# Patient Record
Sex: Female | Born: 1945 | Race: White | Hispanic: No | Marital: Married | Smoking: Never smoker
Health system: Southern US, Community
[De-identification: ages and names within clinical notes are randomized; demographics above are authoritative.]

## PROBLEM LIST (undated history)

## (undated) DIAGNOSIS — I10 Essential (primary) hypertension: Secondary | ICD-10-CM

## (undated) DIAGNOSIS — I2510 Atherosclerotic heart disease of native coronary artery without angina pectoris: Secondary | ICD-10-CM

## (undated) DIAGNOSIS — C801 Malignant (primary) neoplasm, unspecified: Secondary | ICD-10-CM

## (undated) DIAGNOSIS — E785 Hyperlipidemia, unspecified: Secondary | ICD-10-CM

## (undated) HISTORY — PX: CHOLECYSTECTOMY: SHX55

## (undated) HISTORY — PX: LAMINECTOMY: SHX219

## (undated) HISTORY — PX: ABDOMINAL HYSTERECTOMY: SHX81

## (undated) HISTORY — PX: TOTAL HIP ARTHROPLASTY: SHX124

---

## 2009-10-12 ENCOUNTER — Ambulatory Visit: Payer: Self-pay | Admitting: Unknown Physician Specialty

## 2010-04-04 ENCOUNTER — Ambulatory Visit: Payer: Self-pay | Admitting: Family Medicine

## 2010-04-06 ENCOUNTER — Ambulatory Visit: Payer: Self-pay | Admitting: Internal Medicine

## 2010-04-14 ENCOUNTER — Ambulatory Visit: Payer: Self-pay | Admitting: Internal Medicine

## 2010-09-26 DIAGNOSIS — M1990 Unspecified osteoarthritis, unspecified site: Secondary | ICD-10-CM | POA: Insufficient documentation

## 2010-10-16 ENCOUNTER — Ambulatory Visit: Payer: Self-pay | Admitting: Unknown Physician Specialty

## 2011-07-10 DIAGNOSIS — L739 Follicular disorder, unspecified: Secondary | ICD-10-CM | POA: Insufficient documentation

## 2011-11-06 ENCOUNTER — Ambulatory Visit: Payer: Self-pay

## 2011-12-04 DIAGNOSIS — I2510 Atherosclerotic heart disease of native coronary artery without angina pectoris: Secondary | ICD-10-CM | POA: Insufficient documentation

## 2012-11-11 ENCOUNTER — Ambulatory Visit: Payer: Self-pay | Admitting: Family Medicine

## 2012-11-20 ENCOUNTER — Ambulatory Visit: Payer: Self-pay | Admitting: Family Medicine

## 2013-12-10 ENCOUNTER — Other Ambulatory Visit: Payer: Self-pay | Admitting: Ophthalmology

## 2013-12-10 LAB — CBC WITH DIFFERENTIAL/PLATELET
BASOS ABS: 0 10*3/uL (ref 0.0–0.1)
Basophil %: 0.7 %
EOS ABS: 0.1 10*3/uL (ref 0.0–0.7)
EOS PCT: 1.1 %
HCT: 43 % (ref 35.0–47.0)
HGB: 14.8 g/dL (ref 12.0–16.0)
LYMPHS ABS: 1.5 10*3/uL (ref 1.0–3.6)
LYMPHS PCT: 26.6 %
MCH: 33 pg (ref 26.0–34.0)
MCHC: 34.5 g/dL (ref 32.0–36.0)
MCV: 96 fL (ref 80–100)
MONOS PCT: 8.2 %
Monocyte #: 0.5 x10 3/mm (ref 0.2–0.9)
NEUTROS PCT: 63.4 %
Neutrophil #: 3.5 10*3/uL (ref 1.4–6.5)
Platelet: 196 10*3/uL (ref 150–440)
RBC: 4.48 10*6/uL (ref 3.80–5.20)
RDW: 13.4 % (ref 11.5–14.5)
WBC: 5.6 10*3/uL (ref 3.6–11.0)

## 2013-12-10 LAB — SEDIMENTATION RATE: ERYTHROCYTE SED RATE: 9 mm/h (ref 0–30)

## 2013-12-14 ENCOUNTER — Ambulatory Visit: Payer: Self-pay | Admitting: Family Medicine

## 2013-12-15 DIAGNOSIS — Q245 Malformation of coronary vessels: Secondary | ICD-10-CM | POA: Insufficient documentation

## 2013-12-15 DIAGNOSIS — Z8679 Personal history of other diseases of the circulatory system: Secondary | ICD-10-CM | POA: Insufficient documentation

## 2014-01-13 DIAGNOSIS — M1712 Unilateral primary osteoarthritis, left knee: Secondary | ICD-10-CM | POA: Insufficient documentation

## 2014-03-23 DIAGNOSIS — Z8672 Personal history of thrombophlebitis: Secondary | ICD-10-CM | POA: Insufficient documentation

## 2014-04-11 HISTORY — PX: REPLACEMENT TOTAL KNEE: SUR1224

## 2014-04-29 DIAGNOSIS — Z96652 Presence of left artificial knee joint: Secondary | ICD-10-CM | POA: Insufficient documentation

## 2014-08-06 ENCOUNTER — Ambulatory Visit
Admission: EM | Admit: 2014-08-06 | Discharge: 2014-08-06 | Disposition: A | Discharge status: Home / Self Care | Payer: Medicare Other | Attending: Family Medicine | Admitting: Family Medicine

## 2014-08-06 DIAGNOSIS — B029 Zoster without complications: Secondary | ICD-10-CM | POA: Diagnosis not present

## 2014-08-06 HISTORY — DX: Hyperlipidemia, unspecified: E78.5

## 2014-08-06 MED ORDER — VALACYCLOVIR HCL 1 G PO TABS: 1000.0000 mg | ORAL_TABLET | Freq: Three times a day (TID) | ORAL | Status: AC

## 2014-08-06 MED ORDER — GABAPENTIN 300 MG PO CAPS: 300.0000 mg | ORAL_CAPSULE | Freq: Two times a day (BID) | ORAL | Status: DC

## 2014-08-06 NOTE — ED Provider Notes (Signed)
Patient presents with symptoms of left frontal rash on head. Patient states that she noticed a rash yesterday. She does have tingling and numbness of the left side of her head around that region. She also does have a history of herpes zoster 1 documented. Admits to having had vaccination as well. Denies any fever, chills, URI symptoms, weight loss, generalized fatigue. Patient denies any vision problems or severe eye pain.   ROS: Negative except mentioned above.  Vitals per chart reviewed  GENERAL: NAD HEENT: no pharyngeal erythema, no exudate, no erythema of TMs, no cervical LAD, no lesions around or in the eye noted, PERRL, EOMI RESP: CTA B CARD: RRR NEURO: CN II-XII groslly intact  SKIN: vesicular erythematous rash on left frontal aspect of head, no lesions in scalp or in/on ear    A/P:Symptoms and exam suggestive of herpes zoster. Will treat patient with Valtrex, Neurontin. I encourage patient to follow up with the eye doctor if she experiences any eye pain or other eye symptoms. She is to seek medical attention if there are any worsening symptoms. Also encourage patient to avoid large groups, pregnant women, young children, those that may be immunocompromised until lesions have crusted over.   Paulina Fusi, MD 08/06/14 1230

## 2014-08-06 NOTE — Discharge Instructions (Signed)

## 2014-08-06 NOTE — ED Notes (Signed)
Painful, red rash on Left side of face starting Wednesday. States the pain is burning. Located on hairline. Pt reports that she feels the pain radiating to  Orbital area, as well as inside left ear. Pt believes this to be shingles. Pt has a h/o shingles on right leg several years ago. There was a reoccurrence on her facial area some years later. Pt has since had what was believed to shingles after that incident. Pt states that she had the Zostavax about 1 year ago.

## 2014-09-10 ENCOUNTER — Encounter: Payer: Self-pay | Admitting: Gynecology

## 2014-09-10 ENCOUNTER — Ambulatory Visit
Admission: EM | Admit: 2014-09-10 | Discharge: 2014-09-10 | Disposition: A | Discharge status: Home / Self Care | Payer: Medicare Other | Attending: Family Medicine | Admitting: Family Medicine

## 2014-09-10 DIAGNOSIS — L02425 Furuncle of right lower limb: Secondary | ICD-10-CM | POA: Diagnosis not present

## 2014-09-10 HISTORY — DX: Atherosclerotic heart disease of native coronary artery without angina pectoris: I25.10

## 2014-09-10 MED ORDER — DOXYCYCLINE HYCLATE 100 MG PO TABS: 100.0000 mg | ORAL_TABLET | Freq: Two times a day (BID) | ORAL | Status: DC

## 2014-09-10 NOTE — ED Notes (Signed)
Patient c/o blisters on right pinky toe/ right hip x 3 days ago.

## 2014-09-10 NOTE — ED Provider Notes (Signed)
CSN: [id number]     Arrival date & time 09/10/14  1031 History   First MD Initiated Contact with Patient 09/10/14 1114     Chief Complaint  Patient presents with  . Blister   (Consider location/radiation/quality/duration/timing/severity/associated sxs/prior Treatment) HPI Comments: 69 yo female with a 3 days h/o "blisters" to right leg and foot. States has one on top of foot that is draining a little. Denies any fevers or chills.   The history is provided by the patient.    Past Medical History  Diagnosis Date  . Hyperlipidemia   . Cardiovascular disease    Past Surgical History  Procedure Laterality Date  . Replacement total knee Left 04/11/2014  . Total hip arthroplasty    . Cholecystectomy    . Laminectomy    . Abdominal hysterectomy     No family history on file. History  Substance Use Topics  . Smoking status: Never Smoker   . Smokeless tobacco: Never Used  . Alcohol Use: Yes     Comment: OCCASIONAL   OB History    No data available     Review of Systems  Allergies  Augmentin; Darvon; and Keflex  Home Medications   Prior to Admission medications   Medication Sig Start Date End Date Taking? Authorizing Provider  aspirin 81 MG tablet Take 81 mg by mouth daily.   Yes Historical Provider, MD  Calcium-Vitamin D 600-200 MG-UNIT per tablet Take 1 tablet by mouth daily.   Yes Historical Provider, MD  Flaxseed, Linseed, (FLAX SEED OIL PO) Take by mouth.   Yes Historical Provider, MD  Multiple Vitamin (MULTIVITAMIN) tablet Take 1 tablet by mouth daily.   Yes Historical Provider, MD  rosuvastatin (CRESTOR) 10 MG tablet Take 10 mg by mouth daily.   Yes Historical Provider, MD  doxycycline (VIBRA-TABS) 100 MG tablet Take 1 tablet (100 mg total) by mouth 2 (two) times daily. 09/10/14   Norval Gable, MD  gabapentin (NEURONTIN) 300 MG capsule Take 1 capsule (300 mg total) by mouth 2 (two) times daily. 08/06/14 08/13/14  Paulina Fusi, MD   BP 153/69 mmHg  Pulse 83   Temp(Src) 97.7 F (36.5 C) (Tympanic)  Ht 5\' 6"  (1.676 m)  Wt 172 lb (78.019 kg)  BMI 27.77 kg/m2  SpO2 99% Physical Exam  Constitutional: She appears well-developed and well-nourished. No distress.  Skin: She is not diaphoretic.  Five x 1cm erythematous, papulopustular slightly raised and slightly tender skin lesions on right lower extremity (one on dorsum of foot with mild drainage)   Nursing note and vitals reviewed.   ED Course  Procedures (including critical care time) Labs Review Labs Reviewed - No data to display  Imaging Review No results found.   MDM   1. Furuncle of right lower extremity    Discharge Medication List as of 09/10/2014 11:39 AM    START taking these medications   Details  doxycycline (VIBRA-TABS) 100 MG tablet Take 1 tablet (100 mg total) by mouth 2 (two) times daily., Starting 09/10/2014, Until Discontinued, Normal      Plan: 1. diagnosis reviewed with patient 2. rx as per orders; risks, benefits, potential side effects reviewed with patient 3. Recommend supportive treatment with warm compresses to area 4. F/u prn if symptoms worsen or don't improve    Norval Gable, MD 09/10/14 1140

## 2014-11-16 ENCOUNTER — Other Ambulatory Visit: Payer: Self-pay | Admitting: Family Medicine

## 2014-12-14 ENCOUNTER — Other Ambulatory Visit: Payer: Self-pay | Admitting: Family Medicine

## 2014-12-14 DIAGNOSIS — Z1231 Encounter for screening mammogram for malignant neoplasm of breast: Secondary | ICD-10-CM

## 2014-12-20 ENCOUNTER — Ambulatory Visit
Admission: RE | Admit: 2014-12-20 | Discharge: 2014-12-20 | Disposition: A | Discharge status: Home / Self Care | Payer: Medicare Other | Source: Ambulatory Visit | Attending: Family Medicine | Admitting: Family Medicine

## 2014-12-20 DIAGNOSIS — Z1231 Encounter for screening mammogram for malignant neoplasm of breast: Secondary | ICD-10-CM | POA: Diagnosis present

## 2014-12-20 HISTORY — DX: Malignant (primary) neoplasm, unspecified: C80.1

## 2015-01-24 DIAGNOSIS — H1131 Conjunctival hemorrhage, right eye: Secondary | ICD-10-CM | POA: Insufficient documentation

## 2015-01-24 DIAGNOSIS — H532 Diplopia: Secondary | ICD-10-CM | POA: Insufficient documentation

## 2015-01-24 DIAGNOSIS — I6381 Other cerebral infarction due to occlusion or stenosis of small artery: Secondary | ICD-10-CM | POA: Insufficient documentation

## 2015-11-16 ENCOUNTER — Other Ambulatory Visit: Payer: Self-pay | Admitting: Family Medicine

## 2015-11-16 DIAGNOSIS — Z1231 Encounter for screening mammogram for malignant neoplasm of breast: Secondary | ICD-10-CM

## 2015-12-21 ENCOUNTER — Ambulatory Visit
Admission: RE | Admit: 2015-12-21 | Discharge: 2015-12-21 | Disposition: A | Discharge status: Home / Self Care | Payer: Medicare Other | Source: Ambulatory Visit | Attending: Family Medicine | Admitting: Family Medicine

## 2015-12-21 DIAGNOSIS — Z1231 Encounter for screening mammogram for malignant neoplasm of breast: Secondary | ICD-10-CM | POA: Insufficient documentation

## 2016-03-09 ENCOUNTER — Encounter: Payer: Self-pay | Admitting: Gynecology

## 2016-03-09 ENCOUNTER — Ambulatory Visit
Admission: EM | Admit: 2016-03-09 | Discharge: 2016-03-09 | Disposition: A | Discharge status: Home / Self Care | Payer: Medicare Other | Attending: Family Medicine | Admitting: Family Medicine

## 2016-03-09 ENCOUNTER — Ambulatory Visit: Payer: Medicare Other

## 2016-03-09 DIAGNOSIS — M25532 Pain in left wrist: Secondary | ICD-10-CM | POA: Insufficient documentation

## 2016-03-09 DIAGNOSIS — W010XXA Fall on same level from slipping, tripping and stumbling without subsequent striking against object, initial encounter: Secondary | ICD-10-CM | POA: Diagnosis not present

## 2016-03-09 DIAGNOSIS — S62115A Nondisplaced fracture of triquetrum [cuneiform] bone, left wrist, initial encounter for closed fracture: Secondary | ICD-10-CM | POA: Diagnosis not present

## 2016-03-09 NOTE — ED Provider Notes (Addendum)
[facility] ____________________________________________  Time seen: Approximately 6:19 PM  I have reviewed the triage vital signs and the nursing notes.   HISTORY  Chief Complaint Wrist Injury   HPI Brenda English is a 70 y.o. female presents with a complaint of left wrist pain post fall today. Patient reports occurred approximately 1 hour prior to arrival. Patient reports that she was sidestepping between a chair and stockings to put candy in stockings, reports her right foot slid on the carpet, causing her to lose her balance and fall. Patient reports that when she fell she tried to catch herself on outstretched left hand. Patient reports immediate onset of left wrist pain after the fall. Patient reports during the fall she did bump her head on a plastic keyboard, denies loss of consciousness or pain to head. Denies headache, vision changes, nausea, vomiting. Denies any bleeding.  Patient reports continued left wrist pain since injury. Denies any other pain or injury. Reports right hand dominant. Denies any paresthesias, decreased range of motion, pain radiation. Patient reports feels well otherwise. States she did apply ice and take over-the-counter ibuprofen prior to arrival which helped with her pain. States pain is mild at this time. Patient reports she does follow with an orthopedist at [facility] for arthritis and previous replacements. Denies use of anticoagulants.  Denies chest pain, shortness of breath, abdominal pain, neck or back pain, or other complaints.   Valera Castle, MD: PCP    Past Medical History:  Diagnosis Date  . Cancer ([place])    skin ca  . Cardiovascular disease   . Hyperlipidemia     There are no active problems to display for this patient.   Past Surgical History:  Procedure Laterality Date  . ABDOMINAL HYSTERECTOMY    . CHOLECYSTECTOMY    . LAMINECTOMY    . 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 TOTAL HIP ARTHROPLASTY     No  current facility-administered medications for this encounter.   Current Outpatient Prescriptions:  .  aspirin 81 MG tablet, Take 81 mg by mouth daily., Disp: , Rfl:  .  Calcium-Vitamin D 600-200 MG-UNIT per tablet, Take 1 tablet by mouth daily., Disp: , Rfl:  .  Flaxseed, Linseed, (FLAX SEED OIL PO), Take by mouth., Disp: , Rfl:  .  Multiple Vitamin (MULTIVITAMIN) tablet, Take 1 tablet by mouth daily., Disp: , Rfl:  .  rosuvastatin (CRESTOR) 10 MG tablet, Take 10 mg by mouth daily., Disp: , Rfl:  .  doxycycline (VIBRA-TABS) 100 MG tablet, Take 1 tablet (100 mg total) by mouth 2 (two) times daily., Disp: 20 tablet, Rfl: 0 .  gabapentin (NEURONTIN) 300 MG capsule, Take 1 capsule (300 mg total) by mouth 2 (two) times daily., Disp: 14 capsule, Rfl: 0  Allergies Augmentin [amoxicillin-pot clavulanate]; Darvon [propoxyphene]; and Keflex [cephalexin]  Family History  Problem Relation Age of Onset  . Breast cancer Sister 58    Social History Social History  Substance Use Topics  . Smoking status: Never Smoker  . Smokeless tobacco: Never Used  . Alcohol use Yes     Comment: OCCASIONAL    Review of Systems Constitutional: No fever/chills Eyes: No visual changes. ENT: No sore throat. Cardiovascular: Denies chest pain. Respiratory: Denies shortness of breath. Gastrointestinal: No abdominal pain.  No nausea, no vomiting.  No diarrhea.  No constipation. Genitourinary: Negative for dysuria. Musculoskeletal: Negative for back pain. As above.  Skin: Negative for rash. Neurological: Negative for headaches, focal weakness or numbness.  10-point  ROS otherwise negative.  ____________________________________________   PHYSICAL EXAM:  VITAL SIGNS: ED Triage Vitals  Enc Vitals Group     BP 03/09/16 1602 (!) 143/68     Pulse Rate 03/09/16 1602 86     Resp 03/09/16 1602 16     Temp 03/09/16 1602 98.5 F (36.9 C)     Temp Source 03/09/16 1602 Oral     SpO2 03/09/16 1602 96 %     Weight  03/09/16 1604 175 lb (79.4 kg)     Height 03/09/16 1604 5\' 6"  (1.676 m)     Head Circumference --      Peak Flow --      Pain Score 03/09/16 1605 4     Pain Loc --      Pain Edu? --      Excl. in [place]? --     Constitutional: Alert and oriented. Well appearing and in no acute distress. Eyes: Conjunctivae are normal. PERRL. EOMI. ENT      Head: Normocephalic and atraumatic.Nontender.       Mouth/Throat: Mucous membranes are moist. Cardiovascular: Normal rate, regular rhythm. Grossly normal heart sounds.  Good peripheral circulation. Respiratory: Normal respiratory effort without tachypnea nor retractions. Breath sounds are clear and equal bilaterally. No wheezes/rales/rhonchi. Musculoskeletal: Ambulatory with steady gait. No midline cervical, thoracic or lumbar tenderness to palpation. Except : Left mid to lateral wrist mild tenderness to palpation, mild swelling, no ecchymosis, no snuff box tenderness, mild pain with wrist rotation but full range of motion present, able to make full left fist, no left hand motor or tendon deficits noted, bilateral hand grips strong and equal, bilateral distal radial pulses strong and equal, normal distal left hand sensation and capillary refill, left upper extremity otherwise nontender.  Neurologic:  Normal speech and language.  Speech is normal. No gait instability.  Skin:  Skin is warm, dry and intact. No rash noted. Psychiatric: Mood and affect are normal. Speech and behavior are normal. Patient exhibits appropriate insight and judgment   ___________________________________________   LABS (all labs ordered are listed, but only abnormal results are displayed)  Labs Reviewed - No data to display  RADIOLOGY  Dg Wrist Complete Left  Result Date: 03/09/2016 CLINICAL DATA:  [place] this afternoon onto medial LEFT wrist, now with wrist pain and mid LEFT hand pain with grass thinning of objects, history of arthritis, initial encounter EXAM: LEFT WRIST -  COMPLETE 3+ VIEW COMPARISON:  None FINDINGS: Osseous demineralization. Degenerative changes at first [facility] joint. Remain joint spaces preserved. No acute fracture, dislocation, or bone destruction. IMPRESSION: Osseous demineralization with degenerative changes at first [facility] joint. No acute bony abnormalities. Electronically Signed   By: Lavonia Dana M.D.   On: 03/09/2016 16:56   Dg Hand Complete Left  Result Date: 03/09/2016 CLINICAL DATA:  [place] this afternoon onto medial LEFT wrist, now with wrist pain and mid LEFT hand pain with grass thinning of objects, history of arthritis, initial encounter EXAM: LEFT HAND - COMPLETE 3+ VIEW COMPARISON:  None. FINDINGS: Osseous demineralization. Scattered degenerative changes of interphalangeal joints LEFT hand and at first [facility] joint, less at STT joint. Nondisplaced fracture at base of triquetrum. No additional fracture, dislocation or bone destruction. IMPRESSION: Osseous demineralization with scattered degenerative changes LEFT hand. Nondisplaced fracture at base of triquetrum. Electronically Signed   By: Lavonia Dana M.D.   On: 03/09/2016 16:59   ____________________________________________   PROCEDURES Procedures    INITIAL IMPRESSION / ASSESSMENT AND PLAN / ED COURSE  Pertinent labs & imaging results that were available during my care of the patient were reviewed by me and considered in my medical decision making (see chart for details).  Well-appearing patient. No acute distress. Presents with a complaint of left wrist pain post mechanical fall. Reports fall occurred just prior to arrival. Patient denies any other pain or injury. No focal neurological deficits. Mild mid wrist and lateral wrist pain, full range of motion, no motor or tendon deficits. X-rays reviewed. Per radiologist, nondisplaced fracture at the base of the triquetrum. Discussed in detail with patient. Patient applied in left volar OCL splint. Encouraged rest, ice, elevation and orthopedic  follow-up this week. X-ray CD given. Patient denies need for prescription medication.   Discussed follow up with Primary care physician this week. Discussed follow up and return parameters including no resolution or any worsening concerns. Patient verbalized understanding and agreed to plan.   ____________________________________________   FINAL CLINICAL IMPRESSION(S) / ED DIAGNOSES  Final diagnoses:  Nondisplaced fracture of triquetrum (cuneiform) bone, left wrist, initial encounter for closed fracture     Discharge Medication List as of 03/09/2016  5:17 PM      Note: This dictation was prepared with Dragon dictation along with smaller phrase technology. Any transcriptional errors that result from this process are unintentional.    Clinical Course       Marylene Land, NP 03/09/16 1828    Marylene Land, NP 03/09/16 [phone]

## 2016-03-09 NOTE — ED Triage Notes (Signed)
Per patient loss balance at home today and fell to the left and now with left wrist/hand pain.

## 2016-03-09 NOTE — Discharge Instructions (Signed)
Keep in splint. Ice and elevate as discussed.   Follow up with your orthopedist at [facility] this coming week as discussed.   Follow up with your primary care physician this week as needed. Return to Urgent care for new or worsening concerns.

## 2016-03-29 DIAGNOSIS — S62115D Nondisplaced fracture of triquetrum [cuneiform] bone, left wrist, subsequent encounter for fracture with routine healing: Secondary | ICD-10-CM | POA: Insufficient documentation

## 2016-04-05 DIAGNOSIS — J4520 Mild intermittent asthma, uncomplicated: Secondary | ICD-10-CM | POA: Insufficient documentation

## 2016-04-24 DIAGNOSIS — I10 Essential (primary) hypertension: Secondary | ICD-10-CM | POA: Insufficient documentation

## 2016-05-02 DIAGNOSIS — Z96641 Presence of right artificial hip joint: Secondary | ICD-10-CM | POA: Insufficient documentation

## 2016-06-20 DIAGNOSIS — T84050A Periprosthetic osteolysis of internal prosthetic right hip joint, initial encounter: Secondary | ICD-10-CM | POA: Insufficient documentation

## 2016-06-27 DIAGNOSIS — Z96641 Presence of right artificial hip joint: Secondary | ICD-10-CM | POA: Insufficient documentation

## 2016-12-21 ENCOUNTER — Encounter: Payer: Self-pay | Admitting: *Deleted

## 2016-12-21 ENCOUNTER — Ambulatory Visit
Admission: EM | Admit: 2016-12-21 | Discharge: 2016-12-21 | Disposition: A | Discharge status: Home / Self Care | Payer: Medicare Other | Attending: Family Medicine | Admitting: Family Medicine

## 2016-12-21 DIAGNOSIS — Z880 Allergy status to penicillin: Secondary | ICD-10-CM | POA: Insufficient documentation

## 2016-12-21 DIAGNOSIS — N390 Urinary tract infection, site not specified: Secondary | ICD-10-CM

## 2016-12-21 DIAGNOSIS — Z87440 Personal history of urinary (tract) infections: Secondary | ICD-10-CM | POA: Diagnosis present

## 2016-12-21 DIAGNOSIS — I2510 Atherosclerotic heart disease of native coronary artery without angina pectoris: Secondary | ICD-10-CM | POA: Diagnosis not present

## 2016-12-21 DIAGNOSIS — E785 Hyperlipidemia, unspecified: Secondary | ICD-10-CM | POA: Insufficient documentation

## 2016-12-21 DIAGNOSIS — R3915 Urgency of urination: Secondary | ICD-10-CM | POA: Diagnosis present

## 2016-12-21 DIAGNOSIS — R350 Frequency of micturition: Secondary | ICD-10-CM | POA: Diagnosis present

## 2016-12-21 LAB — URINALYSIS, COMPLETE (UACMP) WITH MICROSCOPIC

## 2016-12-21 MED ORDER — NITROFURANTOIN MONOHYD MACRO 100 MG PO CAPS: 100.0000 mg | ORAL_CAPSULE | Freq: Two times a day (BID) | ORAL | 0 refills | Status: DC

## 2016-12-21 NOTE — Discharge Instructions (Signed)
Take medication as prescribed. Rest. Drink plenty of fluids.  ° °Follow up with your primary care physician this week as needed. Return to Urgent care for new or worsening concerns.  ° °

## 2016-12-21 NOTE — ED Triage Notes (Signed)
Patient started having symptoms urinary frequency and retention this AM.

## 2016-12-21 NOTE — ED Provider Notes (Signed)
[facility] ____________________________________________  Time seen: Approximately 12:50 PM  I have reviewed the triage vital signs and the nursing notes.   HISTORY  Chief Complaint Urinary Frequency  HPI Brenda English is a 71 y.o. female presenting for evaluation of urinary frequency, urinary urgency and feeling of incomplete emptying of bladder since this morning. Denies burning with urination. Reports some suprapubic discomfort. Denies fevers, atypical back pain, vomiting, other abdominal discomfort. Reports continues to eat and drink well. States did take over-the-counter Azo prior to arrival which helped with discomfort. Denies known trigger. Reports feels consistent with previous UTIs. Denies recent UTI. States no recent antibiotic use. Denies other aggravating or alleviating factors. Denies chest pain, shortness of breath, abdominal pain. Denies recent sickness. Denies recent antibiotic use.   Valera Castle, MD: PCP   Past Medical History:  Diagnosis Date  . Cancer ([place])    skin ca  . Cardiovascular disease   . Hyperlipidemia     There are no active problems to display for this patient.   Past Surgical History:  Procedure Laterality Date  . ABDOMINAL HYSTERECTOMY    . CHOLECYSTECTOMY    . LAMINECTOMY    . 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 TOTAL HIP ARTHROPLASTY       No current facility-administered medications for this encounter.   Current Outpatient Prescriptions:  .  amLODipine (NORVASC) 2.5 MG tablet, Take 2.5 mg by mouth daily., Disp: , Rfl:  .  aspirin 81 MG tablet, Take 81 mg by mouth daily., Disp: , Rfl:  .  Calcium-Vitamin D 600-200 MG-UNIT per tablet, Take 1 tablet by mouth daily., Disp: , Rfl:  .  Flaxseed, Linseed, (FLAX SEED OIL PO), Take by mouth., Disp: , Rfl:  .  rosuvastatin (CRESTOR) 10 MG tablet, Take 10 mg by mouth daily., Disp: , Rfl:  .  doxycycline (VIBRA-TABS) 100 MG tablet, Take 1 tablet (100 mg total) by  mouth 2 (two) times daily., Disp: 20 tablet, Rfl: 0 .  gabapentin (NEURONTIN) 300 MG capsule, Take 1 capsule (300 mg total) by mouth 2 (two) times daily., Disp: 14 capsule, Rfl: 0 .  Multiple Vitamin (MULTIVITAMIN) tablet, Take 1 tablet by mouth daily., Disp: , Rfl:  .  nitrofurantoin, macrocrystal-monohydrate, (MACROBID) 100 MG capsule, Take 1 capsule (100 mg total) by mouth 2 (two) times daily., Disp: 10 capsule, Rfl: 0  Allergies Augmentin [amoxicillin-pot clavulanate]; Darvon [propoxyphene]; and Keflex [cephalexin]  Family History  Problem Relation Age of Onset  . Breast cancer Sister 47    Social History Social History  Substance Use Topics  . Smoking status: Never Smoker  . Smokeless tobacco: Never Used  . Alcohol use Yes     Comment: OCCASIONAL    Review of Systems Constitutional: No fever/chills Cardiovascular: Denies chest pain. Respiratory: Denies shortness of breath. Gastrointestinal: No abdominal pain.  No nausea, no vomiting.  No diarrhea.  Genitourinary: Positive for dysuria. Musculoskeletal: Negative for back pain. Skin: Negative for rash.   ____________________________________________   PHYSICAL EXAM:  VITAL SIGNS: ED Triage Vitals  Enc Vitals Group     BP 12/21/16 1155 127/69     Pulse Rate 12/21/16 1155 83     Resp 12/21/16 1155 16     Temp 12/21/16 1155 98.1 F (36.7 C)     Temp Source 12/21/16 1155 Oral     SpO2 12/21/16 1155 97 %     Weight 12/21/16 1157 183 lb (83 kg)     Height 12/21/16 1157 5'  6" (1.676 m)     Head Circumference --      Peak Flow --      Pain Score 12/21/16 1157 0     Pain Loc --      Pain Edu? --      Excl. in [place]? --     Constitutional: Alert and oriented. Well appearing and in no acute distress. Cardiovascular: Normal rate, regular rhythm. Grossly normal heart sounds.  Good peripheral circulation. Respiratory: Normal respiratory effort without tachypnea nor retractions. Breath sounds are clear and equal bilaterally.  No wheezes, rales, rhonchi. Gastrointestinal: Soft and nontender. No CVA tenderness. Musculoskeletal: No midline cervical, thoracic or lumbar tenderness to palpation.  Neurologic:  Normal speech and language. Speech is normal. No gait instability.  Skin:  Skin is warm, dry. Psychiatric: Mood and affect are normal. Speech and behavior are normal. Patient exhibits appropriate insight and judgment   ___________________________________________   LABS (all labs ordered are listed, but only abnormal results are displayed)  Labs Reviewed  URINALYSIS, COMPLETE (UACMP) WITH MICROSCOPIC - Abnormal; Notable for the following:       Result Value   Color, Urine ORANGE (*)    APPearance CLOUDY (*)    Glucose, UA   (*)    Value: TEST NOT REPORTED DUE TO COLOR INTERFERENCE OF URINE PIGMENT   Hgb urine dipstick   (*)    Value: TEST NOT REPORTED DUE TO COLOR INTERFERENCE OF URINE PIGMENT   Bilirubin Urine   (*)    Value: TEST NOT REPORTED DUE TO COLOR INTERFERENCE OF URINE PIGMENT   Ketones, ur   (*)    Value: TEST NOT REPORTED DUE TO COLOR INTERFERENCE OF URINE PIGMENT   Protein, ur   (*)    Value: TEST NOT REPORTED DUE TO COLOR INTERFERENCE OF URINE PIGMENT   Nitrite   (*)    Value: TEST NOT REPORTED DUE TO COLOR INTERFERENCE OF URINE PIGMENT   Leukocytes, UA   (*)    Value: TEST NOT REPORTED DUE TO COLOR INTERFERENCE OF URINE PIGMENT   Squamous Epithelial / LPF 6-30 (*)    Bacteria, UA FEW (*)    All other components within normal limits  URINE CULTURE    RADIOLOGY  No results found. ____________________________________________   PROCEDURES Procedures   INITIAL IMPRESSION / ASSESSMENT AND PLAN / ED COURSE  Pertinent labs & imaging results that were available during my care of the patient were reviewed by me and considered in my medical decision making (see chart for details).  Well-appearing patient. No acute distress. Dysuria symptoms since this morning. Urinalysis reviewed, will  culture. Will treat patient with oral Macrobid. Encourage rest, fluids and supportive care.Discussed indication, risks and benefits of medications with patient.  Discussed follow up with Primary care physician this week. Discussed follow up and return parameters including no resolution or any worsening concerns. Patient verbalized understanding and agreed to plan.   ____________________________________________   FINAL CLINICAL IMPRESSION(S) / ED DIAGNOSES  Final diagnoses:  Urinary tract infection without hematuria, site unspecified     Discharge Medication List as of 12/21/2016 12:59 PM    START taking these medications   Details  nitrofurantoin, macrocrystal-monohydrate, (MACROBID) 100 MG capsule Take 1 capsule (100 mg total) by mouth 2 (two) times daily., Starting Sat 12/21/2016, Normal        Note: This dictation was prepared with Dragon dictation along with smaller phrase technology. Any transcriptional errors that result from this process are unintentional.  Marylene Land, NP 12/21/16 1426

## 2016-12-23 LAB — URINE CULTURE

## 2016-12-30 ENCOUNTER — Other Ambulatory Visit: Payer: Self-pay | Admitting: Family Medicine

## 2016-12-30 DIAGNOSIS — Z1231 Encounter for screening mammogram for malignant neoplasm of breast: Secondary | ICD-10-CM

## 2017-01-09 ENCOUNTER — Ambulatory Visit
Admission: RE | Admit: 2017-01-09 | Discharge: 2017-01-09 | Disposition: A | Discharge status: Home / Self Care | Payer: Medicare Other | Source: Ambulatory Visit | Attending: Family Medicine | Admitting: Family Medicine

## 2017-01-09 DIAGNOSIS — Z1231 Encounter for screening mammogram for malignant neoplasm of breast: Secondary | ICD-10-CM | POA: Insufficient documentation

## 2017-02-07 ENCOUNTER — Ambulatory Visit: Payer: Medicare Other

## 2017-02-07 ENCOUNTER — Ambulatory Visit
Admission: EM | Admit: 2017-02-07 | Discharge: 2017-02-07 | Disposition: A | Discharge status: Home / Self Care | Payer: Medicare Other | Attending: Family Medicine | Admitting: Family Medicine

## 2017-02-07 ENCOUNTER — Encounter: Payer: Self-pay | Admitting: *Deleted

## 2017-02-07 DIAGNOSIS — M778 Other enthesopathies, not elsewhere classified: Secondary | ICD-10-CM

## 2017-02-07 DIAGNOSIS — M25531 Pain in right wrist: Secondary | ICD-10-CM | POA: Insufficient documentation

## 2017-02-07 DIAGNOSIS — M779 Enthesopathy, unspecified: Secondary | ICD-10-CM | POA: Diagnosis not present

## 2017-02-07 MED ORDER — MELOXICAM 15 MG PO TABS: 15.0000 mg | ORAL_TABLET | Freq: Every day | ORAL | 0 refills | Status: DC | PRN

## 2017-02-07 NOTE — ED Provider Notes (Signed)
[facility] ____________________________________________  Time seen: Approximately 3:40 PM  I have reviewed the triage vital signs and the nursing notes.   HISTORY  Chief Complaint Wrist Pain   HPI Brenda English is a 71 y.o. female presenting for evaluation of right medial wrist pain present for the last 4-5 days.  Patient does report she may have injured it when bumping into the wall while carrying a laundry basket, but does not recall any pain from that injury.  States that she is right-handed and has been doing a lot of repetitive movements with her right hand.  Reports that she knows she has arthritis in her hand and similar area.  Denies fall or other injury.  Denies any redness, swelling, skin changes.  States pain hurts mostly with thumb movement.  Reports that she did have a previous thumb tendon injury years ago.  Patient reports continues to eat and drink well.  States has taken occasional ibuprofen over the counter, noncontinuous.  Reports otherwise feels well.  Denies renal insufficiency.Denies chest pain, shortness of breath, abdominal pain, or rash. Denies recent sickness. Denies recent antibiotic use.   Valera Castle, MD: PCP   Past Medical History:  Diagnosis Date  . Cancer ([place])    skin ca  . Cardiovascular disease   . Hyperlipidemia     There are no active problems to display for this patient.   Past Surgical History:  Procedure Laterality Date  . ABDOMINAL HYSTERECTOMY    . CHOLECYSTECTOMY    . LAMINECTOMY    . 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 TOTAL HIP ARTHROPLASTY       No current facility-administered medications for this encounter.   Current Outpatient Medications:  .  amLODipine (NORVASC) 2.5 MG tablet, Take 2.5 mg by mouth daily., Disp: , Rfl:  .  aspirin 81 MG tablet, Take 81 mg by mouth daily., Disp: , Rfl:  .  Calcium-Vitamin D 600-200 MG-UNIT per tablet, Take 1 tablet by mouth daily., Disp: , Rfl:  .  Flaxseed,  Linseed, (FLAX SEED OIL PO), Take by mouth., Disp: , Rfl:  .  Multiple Vitamin (MULTIVITAMIN) tablet, Take 1 tablet by mouth daily., Disp: , Rfl:  .  rosuvastatin (CRESTOR) 10 MG tablet, Take 10 mg by mouth daily., Disp: , Rfl:  .  gabapentin (NEURONTIN) 300 MG capsule, Take 1 capsule (300 mg total) by mouth 2 (two) times daily., Disp: 14 capsule, Rfl: 0 .  meloxicam (MOBIC) 15 MG tablet, Take 1 tablet (15 mg total) by mouth daily as needed., Disp: 10 tablet, Rfl: 0  Allergies Augmentin [amoxicillin-pot clavulanate]; Darvon [propoxyphene]; and Keflex [cephalexin]  Family History  Problem Relation Age of Onset  . Breast cancer Sister 75    Social History Social History   Tobacco Use  . Smoking status: Never Smoker  . Smokeless tobacco: Never Used  Substance Use Topics  . Alcohol use: Yes    Comment: OCCASIONAL  . Drug use: No    Review of Systems Constitutional: No fever/chills Cardiovascular: Denies chest pain. Respiratory: Denies shortness of breath. Gastrointestinal: No abdominal pain.   Musculoskeletal: Negative for back pain.  As above. Skin: Negative for rash.   ____________________________________________   PHYSICAL EXAM:  VITAL SIGNS: ED Triage Vitals  Enc Vitals Group     BP 02/07/17 1518 (!) 154/68     Pulse Rate 02/07/17 1518 82     Resp 02/07/17 1518 16     Temp 02/07/17 1518 97.9 F (36.6 C)  Temp Source 02/07/17 1518 Oral     SpO2 02/07/17 1518 100 %     Weight 02/07/17 1521 182 lb (82.6 kg)     Height 02/07/17 1521 5\' 6"  (1.676 m)     Head Circumference --      Peak Flow --      Pain Score 02/07/17 1523 5     Pain Loc --      Pain Edu? --      Excl. in [place]? --     Constitutional: Alert and oriented. Well appearing and in no acute distress. Cardiovascular: Normal rate, regular rhythm. Grossly normal heart sounds.  Good peripheral circulation. Respiratory: Normal respiratory effort without tachypnea nor retractions. Breath sounds are clear  and equal bilaterally. No wheezes, rales, rhonchi. Musculoskeletal: Steady gait.  Bilateral distal radial pulses equal and easily palpated.  Right hand grips left greater than right.  Right hand no motor or tendon deficits noted.  Pain along the radial as of the medial wrist up to first metacarpal, no swelling, no ecchymosis, arthritic appearance present to first MCP, pain increases with resisted thumb flexion and extension, no true snuffbox tenderness, full range of motion present, but pain with rotation. Neurologic:  Normal speech and language. No gross focal neurologic deficits are appreciated. Speech is normal. No gait instability.  Skin:  Skin is warm, dry and intact. No rash noted. Psychiatric: Mood and affect are normal. Speech and behavior are normal. Patient exhibits appropriate insight and judgment   ___________________________________________   LABS (all labs ordered are listed, but only abnormal results are displayed)  Labs Reviewed - No data to display ____________________________________________  RADIOLOGY  Dg Wrist Complete Right  Result Date: 02/07/2017 CLINICAL DATA:  Right wrist pain after injury several days ago EXAM: RIGHT WRIST - COMPLETE 3+ VIEW COMPARISON:  None. FINDINGS: There is no evidence of fracture or dislocation. Narrowing and sclerosis of the first carpometacarpal joint is noted. Soft tissues are unremarkable. IMPRESSION: Osteoarthritis of the first carpometacarpal joint. No acute abnormality seen in the right wrist. Electronically Signed   By: Marijo Conception, M.D.   On: 02/07/2017 16:07   ____________________________________________   PROCEDURES Procedures    INITIAL IMPRESSION / ASSESSMENT AND PLAN / ED COURSE  Pertinent labs & imaging results that were available during my care of the patient were reviewed by me and considered in my medical decision making (see chart for details).  Well-appearing patient.  No acute distress.  Presenting for  evaluation of right medial wrist pain.  X-ray reviewed, osteoarthritis, no acute abnormality noted.  Suspect tendinitis.  Thumb spica splint applied, encourage rest,rest, ice, immobilization but also stretching.  Will start on daily Mobic.  Encourage PCP or orthopedic follow-up for continued pain. Discussed indication, risks and benefits of medications with patient.  Discussed follow up with Primary care physician this week. Discussed follow up and return parameters including no resolution or any worsening concerns. Patient verbalized understanding and agreed to plan.   ____________________________________________   FINAL CLINICAL IMPRESSION(S) / ED DIAGNOSES  Final diagnoses:  Right wrist pain  Right wrist tendinitis     ED Discharge Orders        Ordered    meloxicam (MOBIC) 15 MG tablet  Daily PRN     02/07/17 1618       Note: This dictation was prepared with Dragon dictation along with smaller phrase technology. Any transcriptional errors that result from this process are unintentional.  Marylene Land, NP 02/07/17 1743

## 2017-02-07 NOTE — Discharge Instructions (Signed)
Take medication as prescribed. Rest. Drink plenty of fluids.   Follow up with orthopedic as needed for continued pain.   Follow up with your primary care physician this week as needed. Return to Urgent care for new or worsening concerns.

## 2017-02-07 NOTE — ED Triage Notes (Signed)
Gradual onset right wrist pain past 4-5 days. Denies injury.

## 2017-04-28 DIAGNOSIS — R42 Dizziness and giddiness: Secondary | ICD-10-CM | POA: Insufficient documentation

## 2017-05-10 ENCOUNTER — Encounter: Payer: Self-pay | Admitting: Emergency Medicine

## 2017-05-10 ENCOUNTER — Emergency Department: Payer: Medicare Other

## 2017-05-10 ENCOUNTER — Emergency Department
Admission: EM | Admit: 2017-05-10 | Discharge: 2017-05-10 | Disposition: A | Discharge status: Home / Self Care | Payer: Medicare Other | Attending: Emergency Medicine | Admitting: Emergency Medicine

## 2017-05-10 ENCOUNTER — Ambulatory Visit (INDEPENDENT_AMBULATORY_CARE_PROVIDER_SITE_OTHER)
Admission: EM | Admit: 2017-05-10 | Discharge: 2017-05-10 | Disposition: A | Discharge status: Other Acute Inpatient Hospital | Payer: Medicare Other | Source: Home / Self Care

## 2017-05-10 ENCOUNTER — Other Ambulatory Visit: Payer: Self-pay

## 2017-05-10 DIAGNOSIS — Z9049 Acquired absence of other specified parts of digestive tract: Secondary | ICD-10-CM

## 2017-05-10 DIAGNOSIS — Z96652 Presence of left artificial knee joint: Secondary | ICD-10-CM | POA: Insufficient documentation

## 2017-05-10 DIAGNOSIS — Z79899 Other long term (current) drug therapy: Secondary | ICD-10-CM | POA: Insufficient documentation

## 2017-05-10 DIAGNOSIS — R0609 Other forms of dyspnea: Secondary | ICD-10-CM

## 2017-05-10 DIAGNOSIS — Z7982 Long term (current) use of aspirin: Secondary | ICD-10-CM | POA: Insufficient documentation

## 2017-05-10 DIAGNOSIS — Z880 Allergy status to penicillin: Secondary | ICD-10-CM

## 2017-05-10 DIAGNOSIS — Z791 Long term (current) use of non-steroidal anti-inflammatories (NSAID): Secondary | ICD-10-CM

## 2017-05-10 DIAGNOSIS — Z96649 Presence of unspecified artificial hip joint: Secondary | ICD-10-CM

## 2017-05-10 DIAGNOSIS — R079 Chest pain, unspecified: Secondary | ICD-10-CM

## 2017-05-10 DIAGNOSIS — E785 Hyperlipidemia, unspecified: Secondary | ICD-10-CM | POA: Insufficient documentation

## 2017-05-10 DIAGNOSIS — I10 Essential (primary) hypertension: Secondary | ICD-10-CM | POA: Insufficient documentation

## 2017-05-10 DIAGNOSIS — Z881 Allergy status to other antibiotic agents status: Secondary | ICD-10-CM

## 2017-05-10 DIAGNOSIS — I2510 Atherosclerotic heart disease of native coronary artery without angina pectoris: Secondary | ICD-10-CM

## 2017-05-10 HISTORY — DX: Essential (primary) hypertension: I10

## 2017-05-10 LAB — COMPREHENSIVE METABOLIC PANEL
ALBUMIN: 4.4 g/dL (ref 3.5–5.0)
ALT: 20 U/L (ref 14–54)
ANION GAP: 8 (ref 5–15)
AST: 26 U/L (ref 15–41)
Alkaline Phosphatase: 83 U/L (ref 38–126)
BILIRUBIN TOTAL: 0.9 mg/dL (ref 0.3–1.2)
BUN: 15 mg/dL (ref 6–20)
CHLORIDE: 109 mmol/L (ref 101–111)
CO2: 23 mmol/L (ref 22–32)
Calcium: 9.8 mg/dL (ref 8.9–10.3)
Creatinine, Ser: 0.9 mg/dL (ref 0.44–1.00)
GFR calc Af Amer: >60 mL/min (ref >60)
GLUCOSE: 103 mg/dL — AB (ref 65–99)
POTASSIUM: 4 mmol/L (ref 3.5–5.1)
Sodium: 140 mmol/L (ref 135–145)
TOTAL PROTEIN: 7.6 g/dL (ref 6.5–8.1)

## 2017-05-10 LAB — CBC
HEMATOCRIT: 42.6 % (ref 35.0–47.0)
HEMOGLOBIN: 14.7 g/dL (ref 12.0–16.0)
MCH: 31.6 pg (ref 26.0–34.0)
MCHC: 34.4 g/dL (ref 32.0–36.0)
MCV: 91.9 fL (ref 80.0–100.0)
Platelets: 221 10*3/uL (ref 150–440)
RBC: 4.64 MIL/uL (ref 3.80–5.20)
RDW: 13.7 % (ref 11.5–14.5)
WBC: 6.4 10*3/uL (ref 3.6–11.0)

## 2017-05-10 LAB — TROPONIN I: Troponin I: <0.03 ng/mL (ref <0.03)

## 2017-05-10 MED ORDER — NITROGLYCERIN 0.4 MG SL SUBL: 0.4000 mg | SUBLINGUAL_TABLET | SUBLINGUAL | 3 refills | Status: AC | PRN

## 2017-05-10 MED ORDER — ASPIRIN 81 MG PO CHEW
162.0000 mg | CHEWABLE_TABLET | Freq: Once | ORAL | Status: AC
  Administered 2017-05-10: 162 mg via ORAL

## 2017-05-10 NOTE — ED Triage Notes (Signed)
Mid chest pain since 3am. [facility] this am and had EKG there. Given 162mg  ASA at urgent care also.

## 2017-05-10 NOTE — Discharge Instructions (Signed)
-  Given year chest pain, recent shortness of breath on exertion, and mild swelling in your with your history of heart disease and diabetes, go to recommend that you go to an ER for further evaluation. -Not going to the ER could result in serious complications as well all heart problems presents with severe symptoms. -Follow-up with your primary care provider and your cardiologist.  Keep your cardiology appointment.

## 2017-05-10 NOTE — ED Triage Notes (Signed)
Patient c/o chest pain and SOB that started off and on last night.

## 2017-05-10 NOTE — ED Provider Notes (Signed)
[facility]    CSN: [id number] Arrival date & time: 05/10/17  1036     History   Chief Complaint Chief Complaint  Patient presents with  . Chest Pain    HPI Brenda English is a 72 y.o. female.   Patient is a 72 year old female who presents with complaint of chest pain that started overnight.  Patient states pain is midsternal and describes the pain as similar to a few overworked her chest muscles that resulted in count of pain in the next day.  Patient does report taking her blood pressure overnight was 140 over the 80s and her pulse was 92, which she states is high for her.  Patient reports worsening dyspnea on exertion over the last 3-4 weeks.  She states she went to a concert last night and had to hold onto her husband while walking up the hill coming out of the venue.  Additionally she states she has not been able to walk as far around her property at home as she had in the past.  Patient does have a history of coronary artery disease and is on a statin as well as blood pressure medications.  She is followed by [facility] cardiology.  Patient's chart does show a cardiac cath from back in 2006 which showed a 50% lesion of the LAD.  She did have a stress test back in 2016 which was negative.  She states she is actually due for a follow-up with her cardiologist this Wednesday.  Patient states she does have some chronic mild right lower extremity edema from her hip surgeries and that is why she wears support stockings.  Patient states she did take an aspirin 81 mg this morning as well as her blood pressure medicine.  Patient denies any nausea.  Shortness of breath as above.      Past Medical History:  Diagnosis Date  . Cancer ([place])    skin ca  . Cardiovascular disease   . Hyperlipidemia     There are no active problems to display for this patient.   Past Surgical History:  Procedure Laterality Date  . ABDOMINAL HYSTERECTOMY    . CHOLECYSTECTOMY    . LAMINECTOMY    .  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 TOTAL HIP ARTHROPLASTY      OB History    No data available       Home Medications    Prior to Admission medications   Medication Sig Start Date End Date Taking? Authorizing Provider  amLODipine (NORVASC) 2.5 MG tablet Take 2.5 mg by mouth daily.   Yes [provider]  aspirin 81 MG tablet Take 81 mg by mouth daily.   Yes [provider]  Calcium-Vitamin D 600-200 MG-UNIT per tablet Take 1 tablet by mouth daily.   Yes [provider]  Flaxseed, Linseed, (FLAX SEED OIL PO) Take by mouth.   Yes [provider]  Multiple Vitamin (MULTIVITAMIN) tablet Take 1 tablet by mouth daily.   Yes [provider]  rosuvastatin (CRESTOR) 10 MG tablet Take 10 mg by mouth daily.   Yes [provider]  gabapentin (NEURONTIN) 300 MG capsule Take 1 capsule (300 mg total) by mouth 2 (two) times daily. 08/06/14 08/13/14  Paulina Fusi, MD  meloxicam (MOBIC) 15 MG tablet Take 1 tablet (15 mg total) by mouth daily as needed. 02/07/17   Marylene Land, NP    Family History Family History  Problem Relation Age of Onset  . Breast cancer  Sister 37    Social History Social History   Tobacco Use  . Smoking status: Never Smoker  . Smokeless tobacco: Never Used  Substance Use Topics  . Alcohol use: Yes    Comment: OCCASIONAL  . Drug use: No     Allergies   Augmentin [amoxicillin-pot clavulanate]; Darvon [propoxyphene]; and Keflex [cephalexin]   Review of Systems Review of Systems As noted above in HPI.  Other systems reviewed and found to be negative  Physical Exam Triage Vital Signs ED Triage Vitals  Enc Vitals Group     BP 05/10/17 1048 (!) 146/67     Pulse Rate 05/10/17 1048 99     Resp 05/10/17 1048 16     Temp 05/10/17 1048 98 F (36.7 C)     Temp Source 05/10/17 1048 Oral     SpO2 05/10/17 1048 98 %     Weight 05/10/17 1045 180 lb (81.6 kg)     Height 05/10/17 1045 5\' 6"  (1.676 m)      Head Circumference --      Peak Flow --      Pain Score 05/10/17 1044 3     Pain Loc --      Pain Edu? --      Excl. in [place]? --    No data found.  Updated Vital Signs BP (!) 146/67 (BP Location: Left Arm)   Pulse 99   Temp 98 F (36.7 C) (Oral)   Resp 16   Ht 5\' 6"  (1.676 m)   Wt 180 lb (81.6 kg)   SpO2 98%   BMI 29.05 kg/m    Physical Exam  Constitutional: She is oriented to person, place, and time. She appears well-developed and well-nourished. She does not appear ill.  HENT:  Head: Normocephalic and atraumatic.  Eyes: Pupils are equal, round, and reactive to light.  Neck: Normal range of motion.  Cardiovascular: Normal rate, regular rhythm and normal pulses. Exam reveals no distant heart sounds.    No systolic murmur is present.  No diastolic murmur is present. Pulmonary/Chest: Effort normal and breath sounds normal. No respiratory distress.  Abdominal: Soft. Bowel sounds are normal. There is tenderness.  Musculoskeletal: Normal range of motion.       Right lower leg: She exhibits edema (minimal).       Left lower leg: She exhibits edema (minimal).  Neurological: She is alert and oriented to person, place, and time.  Skin: Skin is warm and dry.  Psychiatric: She has a normal mood and affect.     UC Treatments / Results  Labs (all labs ordered are listed, but only abnormal results are displayed) Labs Reviewed - No data to display  EKG EKG done in the clinic: Sinus rhythm with rate of 99.  No acute ST changes or abnormalities.  No inverted T waves.  Radiology No results found.  Procedures Procedures (including critical care time)  Medications Ordered in UC Medications  aspirin chewable tablet 162 mg (162 mg Oral Given 05/10/17 1130)     Initial Impression / Assessment and Plan / UC Course  I have reviewed the triage vital signs and the nursing notes.  Pertinent labs & imaging results that were available during my care of the patient were reviewed by me  and considered in my medical decision making (see chart for details).     Patient with midsternal chest pain that began overnight.  Patient with known coronary artery disease with a 50% lesion back in 2006.  She does have a recent normal stress test 2016 but she also reports increasing dyspnea on exertion over the last 3-4 weeks and now has some minimal-mild lower extremity edema.  Patient took a 81 mg aspirin this morning, we will go ahead and give her 2 more to bring her up to the recommended 320 mg of aspirin.  I recommended patient proceed to the ER for further evaluation as we do not have the ability to obtain cardiac enzymes here today.  Patient verbalized understanding is in agreement.  She states that she was going to go to her house which is around the corner and have her husband take her to the ER.  Given patient's seemingly stable at this point will agree with that plan.  Make sure she understands possible silent presentations of heart issues and that there could be some serious worsening complications should she not go to be evaluated.  Patient again verbalized understanding is agreement with going to the ER.  Final Clinical Impressions(s) / UC Diagnoses   Final diagnoses:  Chest pain, unspecified type  CAD in native artery  Essential hypertension  DOE (dyspnea on exertion)    ED Discharge Orders    None      Controlled Substance Prescriptions [place] Controlled Substance Registry consulted? Not Applicable   Luvenia Redden, PA-C 05/10/17 1138

## 2017-05-10 NOTE — ED Provider Notes (Signed)
[facility] Emergency Department Provider Note       Time seen: ----------------------------------------- 3:26 PM on 05/10/2017 -----------------------------------------   I have reviewed the triage vital signs and the nursing notes.  HISTORY   Chief Complaint Chest Pain    HPI Brenda English is a 72 y.o. female with a history of cancer, cardiovascular disease, hyperlipidemia and hypertension who presents to the ED for mid chest pain since 3 AM.  Patient states she had chest pain in the midsternum that seemed to be more on the right side.  She saw them in urgent care this morning and had an EKG done there.  She was given 2 baby aspirin at the urgent care also.  She complains of 4 out of 10 chest pain, presents for further evaluation.  Past Medical History:  Diagnosis Date  . Cancer ([place])    skin ca  . Cardiovascular disease   . Hyperlipidemia   . Hypertension     There are no active problems to display for this patient.   Past Surgical History:  Procedure Laterality Date  . ABDOMINAL HYSTERECTOMY    . CHOLECYSTECTOMY    . LAMINECTOMY    . 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 TOTAL HIP ARTHROPLASTY      Allergies Augmentin [amoxicillin-pot clavulanate]; Darvon [propoxyphene]; and Keflex [cephalexin]  Social History Social History   Tobacco Use  . Smoking status: Never Smoker  . Smokeless tobacco: Never Used  Substance Use Topics  . Alcohol use: Yes    Comment: OCCASIONAL  . Drug use: No    Review of Systems Constitutional: Negative for fever. Cardiovascular: Positive for chest pain Respiratory: Negative for shortness of breath. Gastrointestinal: Negative for abdominal pain, vomiting and diarrhea. Genitourinary: Negative for dysuria. Musculoskeletal: Negative for back pain. Skin: Negative for rash. Neurological: Negative for headaches, focal weakness or numbness.  All systems negative/normal/unremarkable except as stated in  the HPI  ____________________________________________   PHYSICAL EXAM:  VITAL SIGNS: ED Triage Vitals  Enc Vitals Group     BP 05/10/17 1254 (!) 168/69     Pulse Rate 05/10/17 1254 88     Resp 05/10/17 1254 18     Temp 05/10/17 1254 (!) 97.5 F (36.4 C)     Temp Source 05/10/17 1254 Oral     SpO2 05/10/17 1254 98 %     Weight 05/10/17 1254 180 lb (81.6 kg)     Height 05/10/17 1254 5\' 6"  (1.676 m)     Head Circumference --      Peak Flow --      Pain Score 05/10/17 1258 4     Pain Loc --      Pain Edu? --      Excl. in [place]? --     Constitutional: Alert and oriented. Well appearing and in no distress. Eyes: Conjunctivae are normal. Normal extraocular movements. ENT   Head: Normocephalic and atraumatic.   Nose: No congestion/rhinnorhea.   Mouth/Throat: Mucous membranes are moist.   Neck: No stridor. Cardiovascular: Normal rate, regular rhythm. No murmurs, rubs, or gallops. Respiratory: Normal respiratory effort without tachypnea nor retractions. Breath sounds are clear and equal bilaterally. No wheezes/rales/rhonchi. Gastrointestinal: Soft and nontender. Normal bowel sounds Musculoskeletal: Nontender with normal range of motion in extremities. No lower extremity tenderness nor edema. Neurologic:  Normal speech and language. No gross focal neurologic deficits are appreciated.  Skin:  Skin is warm, dry and intact. No rash noted. Psychiatric: Mood and affect are normal. Speech  and behavior are normal.  ____________________________________________  EKG: Interpreted by me.  Sinus rhythm the rate of 93 bpm, normal PR interval, normal QRS, normal QT.  ____________________________________________  ED COURSE:  As part of my medical decision making, I reviewed the following data within the [place] History obtained from family if available, nursing notes, old chart and ekg, as well as notes from prior ED visits. Patient presented for chest pain, we  will assess with labs and imaging as indicated at this time.   Procedures ____________________________________________   LABS (pertinent positives/negatives)  Labs Reviewed  COMPREHENSIVE METABOLIC PANEL - Abnormal; Notable for the following components:      Result Value   Glucose, Bld 103 (*)    All other components within normal limits  CBC  TROPONIN I  TROPONIN I    RADIOLOGY  Chest x-ray Is unremarkable ____________________________________________  DIFFERENTIAL DIAGNOSIS   Musculoskeletal pain, GERD, anxiety, unstable angina, PE, pneumothorax, dissection  FINAL ASSESSMENT AND PLAN  Chest pain   Plan: Patient had presented for chest pain. Patient's labs were negative including repeat troponin. Patient's imaging was also negative.  I will encourage continued home medications as prescribed.  I will prescribe nitroglycerin should the chest pain come back.  She has follow-up with her cardiologist this week and may subsequently require repeat cardiac catheterization.   Laurence Aly, MD   Note: This note was generated in part or whole with voice recognition software. Voice recognition is usually quite accurate but there are transcription errors that can and very often do occur. I apologize for any typographical errors that were not detected and corrected.     Earleen Newport, MD 05/10/17 [phone]

## 2017-05-14 DIAGNOSIS — R0789 Other chest pain: Secondary | ICD-10-CM | POA: Insufficient documentation

## 2017-06-24 DIAGNOSIS — K4021 Bilateral inguinal hernia, without obstruction or gangrene, recurrent: Secondary | ICD-10-CM | POA: Insufficient documentation

## 2017-06-24 DIAGNOSIS — K439 Ventral hernia without obstruction or gangrene: Secondary | ICD-10-CM | POA: Insufficient documentation

## 2017-08-19 DIAGNOSIS — L02211 Cutaneous abscess of abdominal wall: Secondary | ICD-10-CM | POA: Insufficient documentation

## 2018-01-07 ENCOUNTER — Other Ambulatory Visit: Payer: Self-pay | Admitting: Family Medicine

## 2018-01-07 DIAGNOSIS — Z1231 Encounter for screening mammogram for malignant neoplasm of breast: Secondary | ICD-10-CM

## 2018-01-15 ENCOUNTER — Ambulatory Visit
Admission: RE | Admit: 2018-01-15 | Discharge: 2018-01-15 | Disposition: A | Discharge status: Home / Self Care | Payer: Medicare Other | Source: Ambulatory Visit | Attending: Family Medicine | Admitting: Family Medicine

## 2018-01-15 DIAGNOSIS — Z1231 Encounter for screening mammogram for malignant neoplasm of breast: Secondary | ICD-10-CM | POA: Diagnosis present

## 2018-01-19 ENCOUNTER — Other Ambulatory Visit: Payer: Self-pay | Admitting: Family Medicine

## 2018-01-19 DIAGNOSIS — N6320 Unspecified lump in the left breast, unspecified quadrant: Secondary | ICD-10-CM

## 2018-01-19 DIAGNOSIS — R928 Other abnormal and inconclusive findings on diagnostic imaging of breast: Secondary | ICD-10-CM

## 2018-01-29 ENCOUNTER — Ambulatory Visit
Admission: RE | Admit: 2018-01-29 | Discharge: 2018-01-29 | Disposition: A | Discharge status: Home / Self Care | Payer: Medicare Other | Source: Ambulatory Visit | Attending: Family Medicine | Admitting: Family Medicine

## 2018-01-29 DIAGNOSIS — N6320 Unspecified lump in the left breast, unspecified quadrant: Secondary | ICD-10-CM

## 2018-01-29 DIAGNOSIS — R928 Other abnormal and inconclusive findings on diagnostic imaging of breast: Secondary | ICD-10-CM

## 2018-02-05 ENCOUNTER — Other Ambulatory Visit: Payer: Self-pay | Admitting: Family Medicine

## 2018-02-05 DIAGNOSIS — R928 Other abnormal and inconclusive findings on diagnostic imaging of breast: Secondary | ICD-10-CM

## 2018-03-26 DIAGNOSIS — M1811 Unilateral primary osteoarthritis of first carpometacarpal joint, right hand: Secondary | ICD-10-CM | POA: Insufficient documentation

## 2018-08-13 ENCOUNTER — Ambulatory Visit
Admission: RE | Admit: 2018-08-13 | Discharge: 2018-08-13 | Disposition: A | Discharge status: Home / Self Care | Payer: Medicare Other | Source: Ambulatory Visit | Attending: Family Medicine | Admitting: Family Medicine

## 2018-08-13 ENCOUNTER — Other Ambulatory Visit: Payer: Self-pay

## 2018-08-13 DIAGNOSIS — R928 Other abnormal and inconclusive findings on diagnostic imaging of breast: Secondary | ICD-10-CM | POA: Diagnosis present

## 2018-09-23 ENCOUNTER — Other Ambulatory Visit: Payer: Self-pay | Admitting: Obstetrics and Gynecology

## 2018-09-23 DIAGNOSIS — N6320 Unspecified lump in the left breast, unspecified quadrant: Secondary | ICD-10-CM

## 2019-01-19 ENCOUNTER — Ambulatory Visit
Admission: RE | Admit: 2019-01-19 | Discharge: 2019-01-19 | Disposition: A | Discharge status: Home / Self Care | Payer: Medicare Other | Source: Ambulatory Visit | Attending: Obstetrics and Gynecology | Admitting: Obstetrics and Gynecology

## 2019-01-19 DIAGNOSIS — N6320 Unspecified lump in the left breast, unspecified quadrant: Secondary | ICD-10-CM

## 2019-01-19 DIAGNOSIS — N6321 Unspecified lump in the left breast, upper outer quadrant: Secondary | ICD-10-CM | POA: Diagnosis not present

## 2019-10-04 ENCOUNTER — Other Ambulatory Visit (INDEPENDENT_AMBULATORY_CARE_PROVIDER_SITE_OTHER): Payer: Self-pay | Admitting: Nurse Practitioner

## 2019-10-04 DIAGNOSIS — I8390 Asymptomatic varicose veins of unspecified lower extremity: Secondary | ICD-10-CM

## 2019-10-05 ENCOUNTER — Ambulatory Visit (INDEPENDENT_AMBULATORY_CARE_PROVIDER_SITE_OTHER): Payer: Medicare Other | Admitting: Nurse Practitioner

## 2019-10-05 ENCOUNTER — Ambulatory Visit (INDEPENDENT_AMBULATORY_CARE_PROVIDER_SITE_OTHER): Payer: Medicare Other

## 2019-10-05 ENCOUNTER — Encounter (INDEPENDENT_AMBULATORY_CARE_PROVIDER_SITE_OTHER): Payer: Self-pay | Admitting: Nurse Practitioner

## 2019-10-05 ENCOUNTER — Other Ambulatory Visit: Payer: Self-pay

## 2019-10-05 VITALS — BP 134/73 | HR 87 | Resp 16 | Ht 66.0 in | Wt 142.6 lb

## 2019-10-05 DIAGNOSIS — I10 Essential (primary) hypertension: Secondary | ICD-10-CM | POA: Diagnosis not present

## 2019-10-05 DIAGNOSIS — I8390 Asymptomatic varicose veins of unspecified lower extremity: Secondary | ICD-10-CM | POA: Diagnosis not present

## 2019-10-05 DIAGNOSIS — I83811 Varicose veins of right lower extremities with pain: Secondary | ICD-10-CM | POA: Diagnosis not present

## 2019-10-05 NOTE — Progress Notes (Signed)
Subjective:    Patient ID: Brenda English, female    DOB: 12-24-1945, 74 y.o.   MRN: [id number] Chief Complaint  Patient presents with  . New Patient (Initial Visit)    ref [place] varicose veins w/bulging    Brenda English is a 74 year old female that presents today on referral of her primary care physician Dr. Kym Groom, in regards to varicose veins.  The patient notes that 1 evening she began to have swelling in the back of her right calf, and she describes the incident as very hard, warm, painful and red.  The patient massage the area and by morning this had mostly resolved.  Upon speaking with her primary care physician who felt that the patient may have some varicosities that may be causing her issues.  The patient has a long and complicated vascular history.  The patient has a previous history of a left and right great saphenous vein ablation.  However prior to her right saphenous vein ablation, the distal portion of the patient's right saphenous vein was used as a patch for the patient's left common iliac artery in 1982 after it was accidentally torn during back surgery.  The patient denies having any extensive claudication-like symptoms or rest pain like symptoms consistent with PAD following the left common iliac artery incident.  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  The patient had a hip replacement in 1998 and was initially thought to have had a DVT she was subsequently placed on heparin and Coumadin and this caused a massive bleed within her recent hip replacement.  The patient has since mostly recovered her use and function however it took approximately 2 years.  The patient denies having any chest pain or shortness of breath.  Noninvasive studies today revealed no evidence of deep venous insufficiency bilaterally.  No evidence of DVT seen bilaterally.  No evidence of superficial venous imposes seen  bilaterally.  The bilateral great saphenous veins were not visualized due to previous ablation.  The right short saphenous vein does have evidence of reflux.   Review of Systems  Cardiovascular: Positive for leg swelling.  Musculoskeletal: Positive for gait problem.  Neurological: Positive for weakness and numbness.  All other systems reviewed and are negative.      Objective:   Physical Exam Vitals reviewed.  HENT:     Head: Normocephalic.  Cardiovascular:     Rate and Rhythm: Normal rate.  Pulmonary:     Effort: Pulmonary effort is normal.  Musculoskeletal:     Right lower leg: Edema present.  Neurological:     Mental Status: She is alert and oriented to person, place, and time.  Psychiatric:        Mood and Affect: Mood normal.        Behavior: Behavior normal.        Thought Content: Thought content normal.        Judgment: Judgment normal.     BP 134/73 (BP Location: Right Arm)   Pulse 87   Resp 16   Ht 5\' 6"  (1.676 m)   Wt 142 lb 9.6 oz (64.7 kg)   BMI 23.02 kg/m   Past Medical History:  Diagnosis Date  . Cancer ([place])    skin ca  . Cardiovascular disease   . Hyperlipidemia   . Hypertension     Social History   Socioeconomic History  . Marital status: Married    Spouse name:  Not on file  . Number of children: Not on file  . Years of education: Not on file  . Highest education level: Not on file  Occupational History  . Not on file  Tobacco Use  . Smoking status: Never Smoker  . Smokeless tobacco: Never Used  Vaping Use  . Vaping Use: Never used  Substance and Sexual Activity  . Alcohol use: Yes    Comment: OCCASIONAL  . Drug use: No  . Sexual activity: Not on file  Other Topics Concern  . Not on file  Social History Narrative  . Not on file   Social Determinants of Health   Financial Resource Strain:   . Difficulty of Paying Living Expenses:   Food Insecurity:   . Worried About Charity fundraiser in the Last Year:   . Academic librarian in the Last Year:   Transportation Needs:   . Film/video editor (Medical):   Marland Kitchen Lack of Transportation (Non-Medical):   Physical Activity:   . Days of Exercise per Week:   . Minutes of Exercise per Session:   Stress:   . Feeling of Stress :   Social Connections:   . Frequency of Communication with Friends and Family:   . Frequency of Social Gatherings with Friends and Family:   . Attends Religious Services:   . Active Member of Clubs or Organizations:   . Attends Archivist Meetings:   Marland Kitchen Marital Status:   Intimate Partner Violence:   . Fear of Current or Ex-Partner:   . Emotionally Abused:   Marland Kitchen Physically Abused:   . Sexually Abused:     Past Surgical History:  Procedure Laterality Date  . ABDOMINAL HYSTERECTOMY    . CHOLECYSTECTOMY    . LAMINECTOMY    . 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 TOTAL HIP ARTHROPLASTY      Family History  Problem Relation Age of Onset  . Breast cancer Sister 67    Allergies  Allergen Reactions  . Augmentin [Amoxicillin-Pot Clavulanate] Hives  . Darvon [Propoxyphene] Nausea And Vomiting  . Keflex [Cephalexin] Swelling       Assessment & Plan:   1. Varicose veins of right lower extremity with pain Recommend:  The patient is complaining of varicose veins.    I have had a long discussion with the patient regarding  varicose veins and why they cause symptoms.  Patient will begin wearing graduated compression stockings on a daily basis, beginning first thing in the morning and removing them in the evening. The patient is instructed specifically not to sleep in the stockings.    In addition, behavioral modification including elevation during the day will be initiated, utilizing a recliner was recommended.  The patient is also instructed to continue exercising such as walking 4-5 times per week.  At this time the patient wishes to continue conservative therapy and is not interested in more invasive treatments such as  laser ablation and sclerotherapy.  The Patient will follow up PRN if the symptoms worsen.  2. Essential hypertension Continue antihypertensive medications as already ordered, these medications have been reviewed and there are no changes at this time.    Current Outpatient Medications on File Prior to Visit  Medication Sig Dispense Refill  . amLODipine (NORVASC) 2.5 MG tablet Take 2.5 mg by mouth daily.    Marland Kitchen aspirin 81 MG tablet Take 81 mg by mouth daily.    . Calcium-Vitamin D 600-200 MG-UNIT per tablet Take 1  tablet by mouth daily.    . Flaxseed, Linseed, (FLAX SEED OIL PO) Take 1 tablet by mouth daily.     . Multiple Vitamin (MULTIVITAMIN) tablet Take 1 tablet by mouth daily.    . rosuvastatin (CRESTOR) 10 MG tablet Take 10 mg by mouth daily.    Marland Kitchen gabapentin (NEURONTIN) 300 MG capsule Take 1 capsule (300 mg total) by mouth 2 (two) times daily. (Patient not taking: Reported on 05/10/2017) 14 capsule 0  . meloxicam (MOBIC) 15 MG tablet Take 1 tablet (15 mg total) by mouth daily as needed. (Patient not taking: Reported on 05/10/2017) 10 tablet 0  . nitroGLYCERIN (NITROSTAT) 0.4 MG SL tablet Place 1 tablet (0.4 mg total) under the tongue every 5 (five) minutes as needed for chest pain. 100 tablet 3   No current facility-administered medications on file prior to visit.    There are no Patient Instructions on file for this visit. No follow-ups on file.   Kris Hartmann, NP

## 2019-12-17 ENCOUNTER — Other Ambulatory Visit: Payer: Self-pay | Admitting: Family Medicine

## 2019-12-17 DIAGNOSIS — Z1231 Encounter for screening mammogram for malignant neoplasm of breast: Secondary | ICD-10-CM

## 2020-01-20 ENCOUNTER — Ambulatory Visit
Admission: RE | Admit: 2020-01-20 | Discharge: 2020-01-20 | Disposition: A | Discharge status: Home / Self Care | Payer: Medicare Other | Source: Ambulatory Visit | Attending: Family Medicine | Admitting: Family Medicine

## 2020-01-20 ENCOUNTER — Other Ambulatory Visit: Payer: Self-pay

## 2020-01-20 DIAGNOSIS — Z1231 Encounter for screening mammogram for malignant neoplasm of breast: Secondary | ICD-10-CM | POA: Diagnosis not present

## 2020-11-26 ENCOUNTER — Other Ambulatory Visit: Payer: Self-pay

## 2020-11-26 ENCOUNTER — Encounter: Payer: Self-pay | Admitting: Emergency Medicine

## 2020-11-26 ENCOUNTER — Ambulatory Visit
Admission: EM | Admit: 2020-11-26 | Discharge: 2020-11-26 | Disposition: A | Discharge status: Home / Self Care | Payer: Medicare Other | Attending: Emergency Medicine | Admitting: Emergency Medicine

## 2020-11-26 DIAGNOSIS — U071 COVID-19: Secondary | ICD-10-CM | POA: Diagnosis not present

## 2020-11-26 MED ORDER — BENZONATATE 100 MG PO CAPS: 200.0000 mg | ORAL_CAPSULE | Freq: Three times a day (TID) | ORAL | 0 refills | Status: DC

## 2020-11-26 MED ORDER — IPRATROPIUM BROMIDE 0.06 % NA SOLN: 2.0000 | Freq: Four times a day (QID) | NASAL | 12 refills | Status: DC

## 2020-11-26 MED ORDER — NIRMATRELVIR/RITONAVIR (PAXLOVID)TABLET: 3.0000 | ORAL_TABLET | Freq: Two times a day (BID) | ORAL | 0 refills | Status: AC

## 2020-11-26 MED ORDER — PROMETHAZINE-DM 6.25-15 MG/5ML PO SYRP: 5.0000 mL | ORAL_SOLUTION | Freq: Four times a day (QID) | ORAL | 0 refills | Status: DC | PRN

## 2020-11-26 NOTE — Discharge Instructions (Addendum)
You will have to quarantine for 5 days from the start of your symptoms.  After 5 days you can break quarantine if your symptoms have improved and you have not had a fever for 24 hours without taking Tylenol or ibuprofen.  Use over-the-counter Tylenol and ibuprofen as needed for body aches and fever.  Use the Tessalon Perles during the day as needed for cough and the Promethazine DM cough syrup at nighttime as will make you drowsy.  Take the Paxlovid twice daily for 5 days for treatment of COVID-19.  Use the Atrovent nasal spray, 2 squirts in each nostril every 6 hours, as needed for nasal congestion and runny nose.  If you develop any increased shortness of breath-especially at rest, you are unable to speak in full sentences, or is a late sign your lips are turning blue you need to go the ER for evaluation.

## 2020-11-26 NOTE — ED Provider Notes (Signed)
[facility]    CSN: [id number] Arrival date & time: 11/26/20  0818      History   Chief Complaint Chief Complaint  Patient presents with   Covid Positive   Nasal Congestion   Cough    HPI Brenda Brenda English is a 75 y.o. Brenda English.   HPI  75 year old Brenda English here for evaluation of respiratory complaints.  Patient reports that she has been experiencing a nonproductive cough, runny nose with postnasal drip, nasal congestion, headache, and sneezing that began on 11/18/2020 when she and her husband were in [place].  Her husband was evaluated in this urgent care 2 days ago and tested positive for COVID.  She took a home test yesterday and was positive.  She reports that when she took the test that the test Lyme came up dark immediately as the test was processing.  She states that since the onset of symptoms she has waxed and waned with feeling achy, increased sneezing or runny nose, and hot flashes.  She is also been experiencing achiness in her back.  She denies fever, shortness breath or wheezing, or GI complaints.  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/20/2016   History of right hip replacement 05/02/2016   Essential hypertension 04/24/2016   Mild intermittent asthma without complication 123XX123   Closed nondisplaced fracture of triquetrum of left wrist with routine healing 03/29/2016   Diplopia 01/24/2015   Lacunar infarction ([place]) 01/24/2015    Subconjunctival hemorrhage of right eye 01/24/2015   H/O total knee replacement, left 04/29/2014   H/O thrombophlebitis 03/23/2014   Primary osteoarthritis of left knee 01/13/2014   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   History of hypertension 12/15/2013   Vertigo 05/26/2012   Coronary artery disease 12/04/2011   Back pain 11/19/2011   Arthritis of knee 09/04/2011   Difficulty in walking 08/19/2011   Sciatica 08/19/2011   AK (actinic keratosis) 07/10/2011   Basal cell carcinoma 07/10/2011   Dermatofibroma 123XX123   Folliculitis 123XX123   Seborrheic keratosis, inflamed 07/10/2011   Esophageal varices ([place]) 09/26/2010   Mixed hyperlipidemia 09/26/2010   Osteoarthritis deformans 09/26/2010   Transient limb paralysis 09/26/2010   Venous (peripheral) insufficiency 09/26/2010    Past Surgical History:  Procedure Laterality Date   ABDOMINAL HYSTERECTOMY     CHOLECYSTECTOMY     LAMINECTOMY     REPLACEMENT TOTAL KNEE Left 04/11/2014   TOTAL HIP ARTHROPLASTY      OB History   No obstetric history on file.      Home Medications    Prior to Admission medications   Medication Sig Start Date End Date Taking? Authorizing Provider  amLODipine (NORVASC) 2.5 MG tablet Take 2.5 mg by mouth daily.   Yes [provider]  aspirin 81 MG tablet Take 81 mg by mouth daily.   Yes [provider]  benzonatate (TESSALON) 100 MG capsule Take 2 capsules (200 mg total) by mouth every 8 (  eight) hours. 11/26/20  Yes Margarette Canada, NP  Calcium-Vitamin D 600-200 MG-UNIT per tablet Take 1 tablet by mouth daily.   Yes [provider]  Flaxseed, Linseed, (FLAX SEED OIL PO) Take 1 tablet by mouth daily.    Yes [provider]  ipratropium (ATROVENT) 0.06 % nasal spray Place 2 sprays into both nostrils 4 (four) times daily. 11/26/20  Yes Margarette Canada, NP  Multiple Vitamin (MULTIVITAMIN) tablet Take 1 tablet by mouth daily.   Yes [provider]   nirmatrelvir/ritonavir EUA (PAXLOVID) 20 x 150 MG & 10 x '100MG'$  TABS Take 3 tablets by mouth 2 (two) times daily for 5 days. Patient GFR is >60. Take nirmatrelvir (150 mg) two tablets twice daily for 5 days and ritonavir (100 mg) one tablet twice daily for 5 days. 11/26/20 12/01/20 Yes Margarette Canada, NP  promethazine-dextromethorphan (PROMETHAZINE-DM) 6.25-15 MG/5ML syrup Take 5 mLs by mouth 4 (four) times daily as needed. 11/26/20  Yes Margarette Canada, NP  rosuvastatin (CRESTOR) 10 MG tablet Take 10 mg by mouth daily.   Yes [provider]  nitroGLYCERIN (NITROSTAT) 0.4 MG SL tablet Place 1 tablet (0.4 mg total) under the tongue every 5 (five) minutes as needed for chest pain. 05/10/17 05/10/18  Earleen Newport, MD    Family History Family History  Problem Relation Age of Onset   Breast cancer Sister 58    Social History Social History   Tobacco Use   Smoking status: Never   Smokeless tobacco: Never  Vaping Use   Vaping Use: Never used  Substance Use Topics   Alcohol use: Yes    Comment: OCCASIONAL   Drug use: No     Allergies   Augmentin [amoxicillin-pot clavulanate], Darvon [propoxyphene], and Keflex [cephalexin]   Review of Systems Review of Systems  Constitutional:  Positive for chills and fatigue. Negative for activity change, appetite change and fever.  HENT:  Positive for congestion, postnasal drip, rhinorrhea and sore throat. Negative for ear pain.   Respiratory:  Positive for cough. Negative for shortness of breath and wheezing.   Gastrointestinal:  Negative for diarrhea, nausea and vomiting.  Musculoskeletal:  Positive for back pain. Negative for myalgias.  Skin:  Negative for rash.  Neurological:  Positive for headaches.  Hematological: Negative.   Psychiatric/Behavioral: Negative.      Physical Exam Triage Vital Signs ED Triage Vitals  Enc Vitals Group     BP 11/26/20 0854 134/67     Pulse Rate 11/26/20 0854 79     Resp 11/26/20 0854 14     Temp  11/26/20 0854 98.3 F (36.8 C)     Temp Source 11/26/20 0854 Oral     SpO2 11/26/20 0854 98 %     Weight 11/26/20 0851 148 lb (67.1 kg)     Height 11/26/20 0851 '5\' 6"'$  (1.676 m)     Head Circumference --      Peak Flow --      Pain Score 11/26/20 0851 0     Pain Loc --      Pain Edu? --      Excl. in [place]? --    No data found.  Updated Vital Signs BP 134/67 (BP Location: Left Arm)   Pulse 79   Temp 98.3 F (36.8 C) (Oral)   Resp 14   Ht '5\' 6"'$  (1.676 m)   Wt 148 lb (67.1 kg)   SpO2 98%   BMI 23.89 kg/m   Visual Acuity Right Eye Distance:  Left Eye Distance:   Bilateral Distance:    Right Eye Near:   Left Eye Near:    Bilateral Near:     Physical Exam Vitals and nursing note reviewed.  Constitutional:      General: She is not in acute distress.    Appearance: Normal appearance. She is not ill-appearing.  HENT:     Head: Normocephalic and atraumatic.     Right Ear: Tympanic membrane, ear canal and external ear normal. There is no impacted cerumen.     Left Ear: Tympanic membrane, ear canal and external ear normal. There is no impacted cerumen.     Nose: Congestion and rhinorrhea present.     Mouth/Throat:     Mouth: Mucous membranes are moist.     Pharynx: Oropharynx is clear. No posterior oropharyngeal erythema.  Cardiovascular:     Rate and Rhythm: Normal rate and regular rhythm.     Pulses: Normal pulses.     Heart sounds: Normal heart sounds. No murmur heard.   No gallop.  Pulmonary:     Effort: Pulmonary effort is normal.     Breath sounds: Normal breath sounds. No wheezing, rhonchi or rales.  Musculoskeletal:     Cervical back: Normal range of motion and neck supple.  Lymphadenopathy:     Cervical: No cervical adenopathy.  Skin:    General: Skin is warm and dry.     Capillary Refill: Capillary refill takes less than 2 seconds.     Findings: No erythema or rash.  Neurological:     General: No focal deficit present.     Mental Status: She is alert and  oriented to person, place, and time.  Psychiatric:        Mood and Affect: Mood normal.        Behavior: Behavior normal.        Thought Content: Thought content normal.        Judgment: Judgment normal.     UC Treatments / Results  Labs (all labs ordered are listed, but only abnormal results are displayed) Labs Reviewed - No data to display  EKG   Radiology No results found.  Procedures Procedures (including critical care time)  Medications Ordered in UC Medications - No data to display  Initial Impression / Assessment and Plan / UC Course  I have reviewed the triage vital signs and the nursing notes.  Pertinent labs & imaging results that were available during my care of the patient were reviewed by me and considered in my medical decision making (see chart for details).  Patient is a very pleasant, nontoxic-appearing 75 year old Brenda English here for evaluation of respiratory complaints who tested positive for COVID yesterday.  Her husband tested positive for COVID 2 days ago.  She reports onset of respiratory symptoms 8 days ago when she was in [place] that have waxed and waned in the interim.  Patient's physical exam reveals pearly gray tympanic membranes bilaterally with a normal light reflex and clear external auditory canals.  Nasal mucosa is mildly erythematous and edematous with scant clear nasal discharge.  Oropharyngeal exam is benign.  No cervical lymphadenopathy appreciated on exam.  Cardiopulmonary exam reveals clear lung sounds in all fields.  She has a significant past medical history to include basal cell carcinoma, coronary artery disease, essential hypertension, and peripheral venous insufficiency.  She also has had a lacunar infarct.  Due to the unclear course of her illness will prescribe Paxlovid antiviral therapy for treatment of COVID-19.  I  have also prescribed Atrovent nasal spray and Tessalon Perles to help the patient with her cough, runny nose, nasal congestion,  and postnasal drip.   Final Clinical Impressions(s) / UC Diagnoses   Final diagnoses:  [id number]     Discharge Instructions      You will have to quarantine for 5 days from the start of your symptoms.  After 5 days you can break quarantine if your symptoms have improved and you have not had a fever for 24 hours without taking Tylenol or ibuprofen.  Use over-the-counter Tylenol and ibuprofen as needed for body aches and fever.  Use the Tessalon Perles during the day as needed for cough and the Promethazine DM cough syrup at nighttime as will make you drowsy.  Take the Paxlovid twice daily for 5 days for treatment of COVID-19.  Use the Atrovent nasal spray, 2 squirts in each nostril every 6 hours, as needed for nasal congestion and runny nose.  If you develop any increased shortness of breath-especially at rest, you are unable to speak in full sentences, or is a late sign your lips are turning blue you need to go the ER for evaluation.      ED Prescriptions     Medication Sig Dispense Auth. Provider   benzonatate (TESSALON) 100 MG capsule Take 2 capsules (200 mg total) by mouth every 8 (eight) hours. 21 capsule Margarette Canada, NP   ipratropium (ATROVENT) 0.06 % nasal spray Place 2 sprays into both nostrils 4 (four) times daily. 15 mL Margarette Canada, NP   promethazine-dextromethorphan (PROMETHAZINE-DM) 6.25-15 MG/5ML syrup Take 5 mLs by mouth 4 (four) times daily as needed. 118 mL Margarette Canada, NP   nirmatrelvir/ritonavir EUA (PAXLOVID) 20 x 150 MG & 10 x '100MG'$  TABS Take 3 tablets by mouth 2 (two) times daily for 5 days. Patient GFR is >60. Take nirmatrelvir (150 mg) two tablets twice daily for 5 days and ritonavir (100 mg) one tablet twice daily for 5 days. 30 tablet Margarette Canada, NP      PDMP not reviewed this encounter.   Margarette Canada, NP 11/26/20 [phone]

## 2020-11-26 NOTE — ED Triage Notes (Signed)
Patient c/o cough, runny nose, sneezing, and headache that started around 11/18/20.   Patient's husband tested positive for covid on Friday.  Patient took home covid test yesterday and was positive. Patient denies fevers.

## 2020-12-19 ENCOUNTER — Other Ambulatory Visit: Payer: Self-pay | Admitting: Family Medicine

## 2020-12-19 DIAGNOSIS — Z1231 Encounter for screening mammogram for malignant neoplasm of breast: Secondary | ICD-10-CM

## 2021-01-22 ENCOUNTER — Other Ambulatory Visit: Payer: Self-pay

## 2021-01-22 ENCOUNTER — Ambulatory Visit
Admission: RE | Admit: 2021-01-22 | Discharge: 2021-01-22 | Disposition: A | Discharge status: Home / Self Care | Payer: Medicare Other | Source: Ambulatory Visit | Attending: Family Medicine | Admitting: Family Medicine

## 2021-01-22 DIAGNOSIS — Z1231 Encounter for screening mammogram for malignant neoplasm of breast: Secondary | ICD-10-CM | POA: Diagnosis not present

## 2021-07-21 IMAGING — MG DIGITAL SCREENING BILAT W/ TOMO W/ CAD
8 series · 8 of 24 positions shown · non-contrast
Comparison: Previous exam(s).

CLINICAL DATA: Screening.

EXAM:
DIGITAL SCREENING BILATERAL MAMMOGRAM WITH TOMO AND CAD

[R MLO synth-2D]
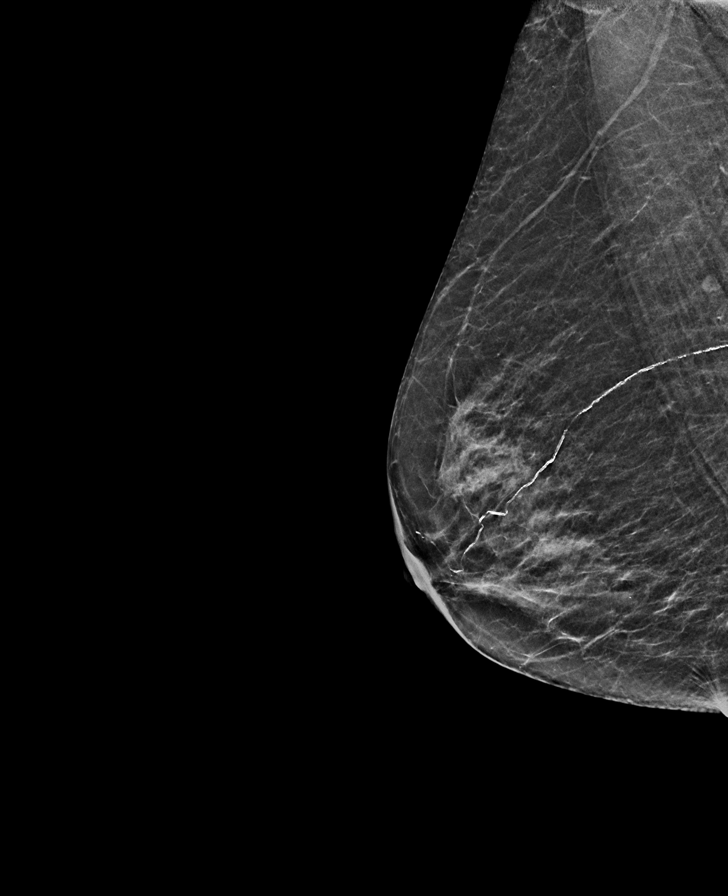

[R CC synth-2D]
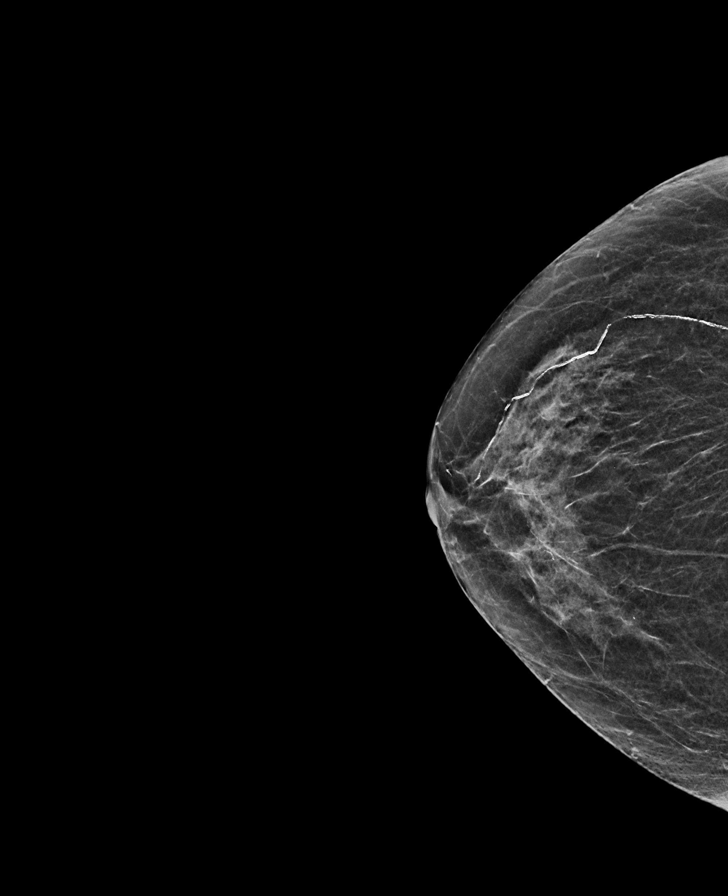

[L CC synth-2D]
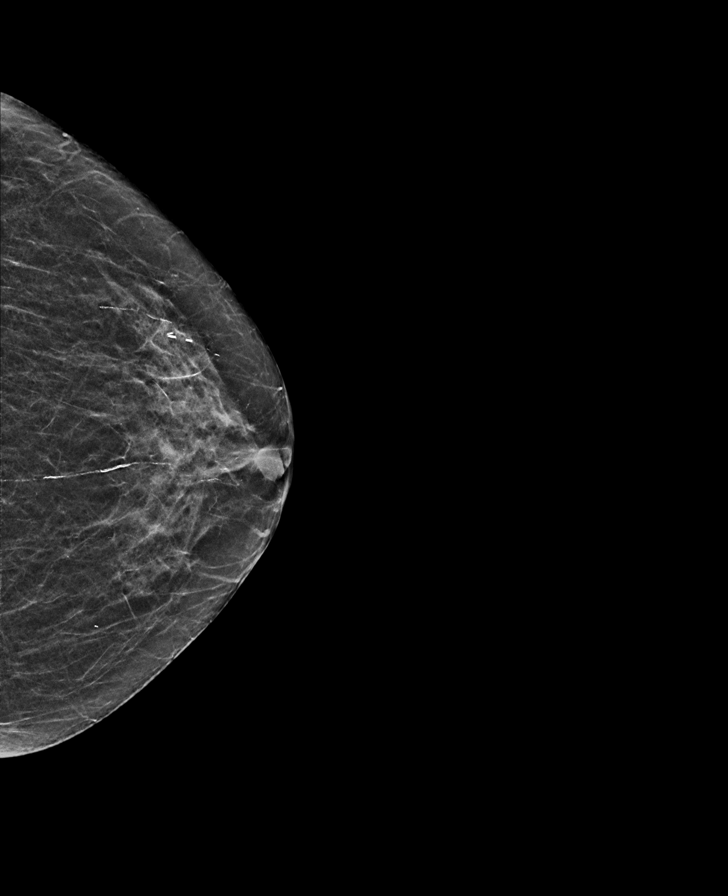

[L MLO synth-2D]
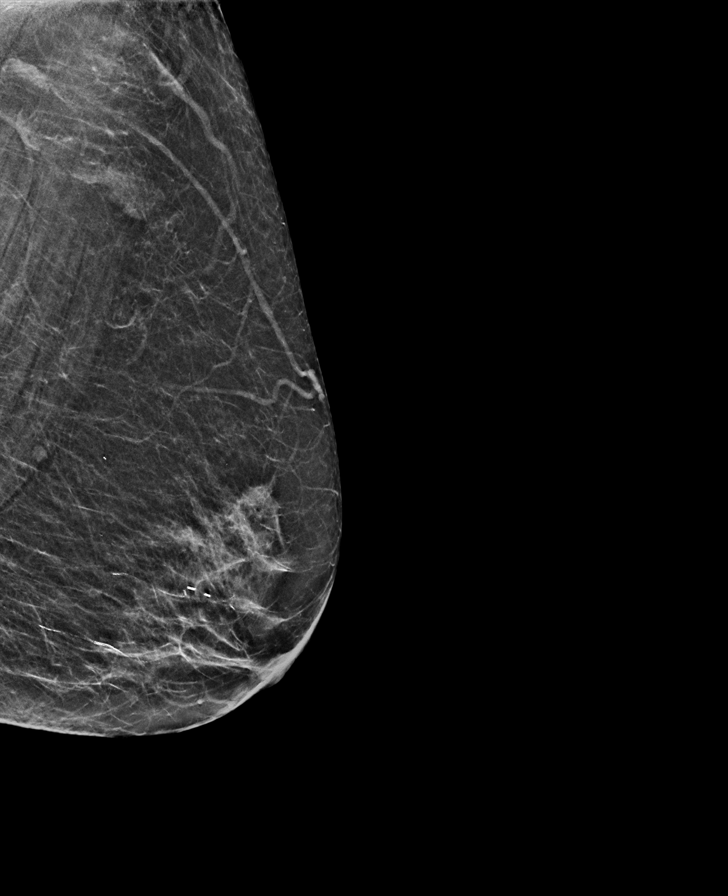

[L CC tomo · tomo slice 21/41.0]
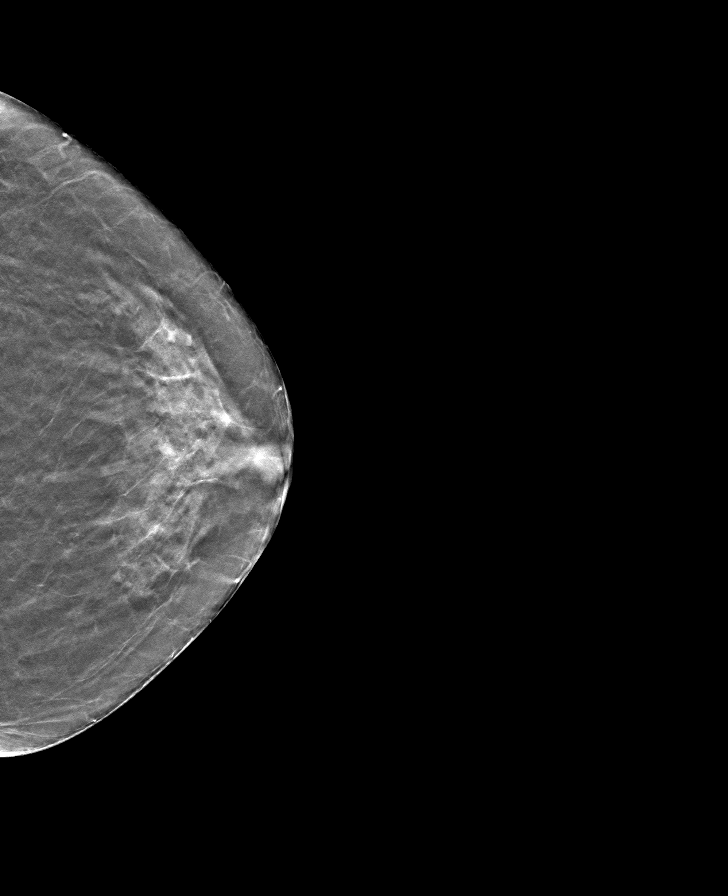

[R CC tomo · tomo slice 23/46.0]
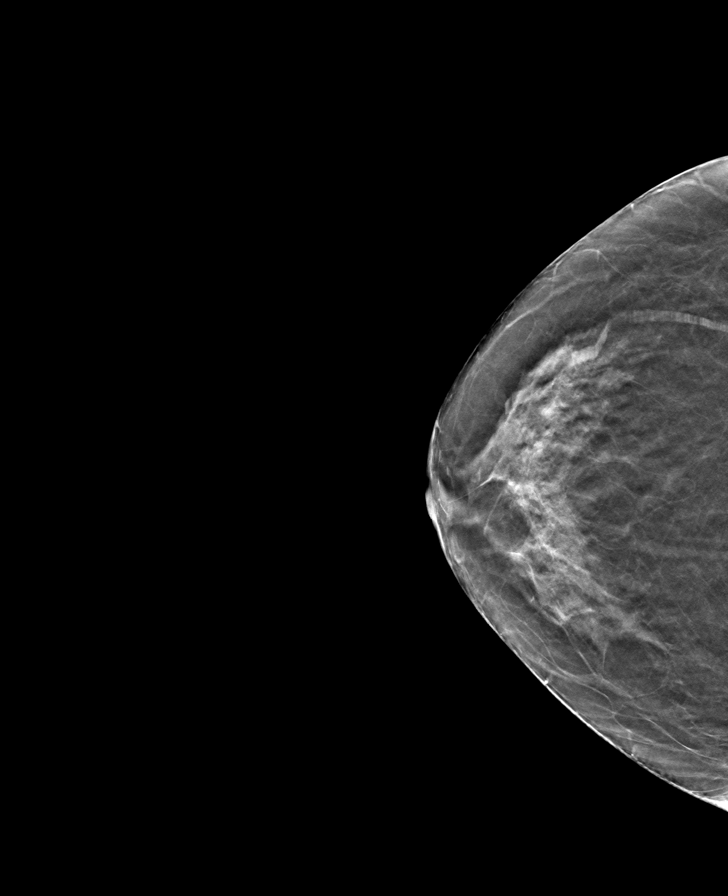

[R MLO tomo · tomo slice 23/44.0]
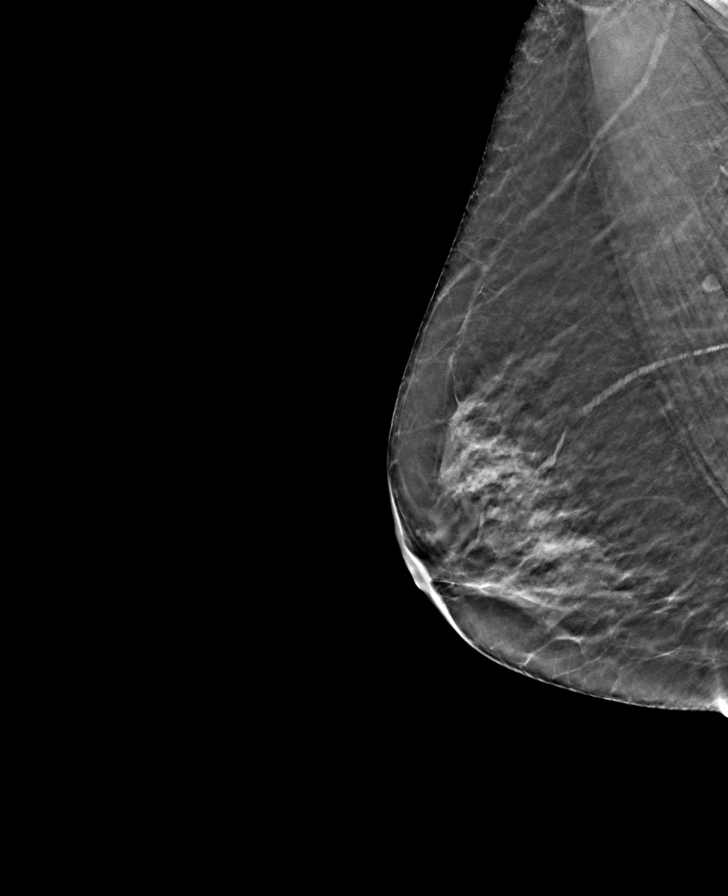

[L MLO tomo · tomo slice 23/46.0]
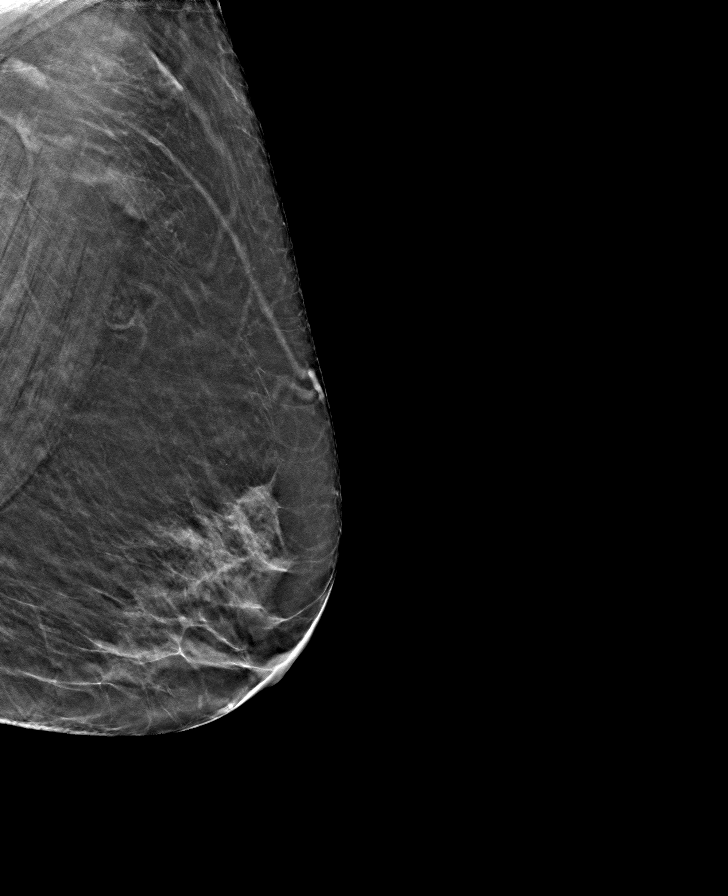

[8 of 24 positions shown; findings below may reference images not displayed]

ACR Breast Density Category c: The breast tissue is heterogeneously
dense, which may obscure small masses.
FINDINGS: There are no findings suspicious for malignancy. Images were
processed with CAD.
IMPRESSION: No mammographic evidence of malignancy. A result letter of this
screening mammogram will be mailed directly to the patient.

RECOMMENDATION:
Screening mammogram in one year. (Code:FT-U-LHB)

BI-RADS CATEGORY  1: Negative.

## 2021-12-12 ENCOUNTER — Other Ambulatory Visit: Payer: Self-pay | Admitting: Family Medicine

## 2021-12-12 DIAGNOSIS — Z1231 Encounter for screening mammogram for malignant neoplasm of breast: Secondary | ICD-10-CM

## 2022-01-14 ENCOUNTER — Encounter (INDEPENDENT_AMBULATORY_CARE_PROVIDER_SITE_OTHER): Payer: Self-pay

## 2022-01-23 ENCOUNTER — Ambulatory Visit
Admission: RE | Admit: 2022-01-23 | Discharge: 2022-01-23 | Disposition: A | Discharge status: Home / Self Care | Payer: Medicare Other | Source: Ambulatory Visit | Attending: Family Medicine | Admitting: Family Medicine

## 2022-01-23 DIAGNOSIS — Z1231 Encounter for screening mammogram for malignant neoplasm of breast: Secondary | ICD-10-CM

## 2023-01-13 ENCOUNTER — Other Ambulatory Visit: Payer: Self-pay | Admitting: Family Medicine

## 2023-01-13 DIAGNOSIS — Z1231 Encounter for screening mammogram for malignant neoplasm of breast: Secondary | ICD-10-CM

## 2023-01-27 ENCOUNTER — Ambulatory Visit
Admission: RE | Admit: 2023-01-27 | Discharge: 2023-01-27 | Disposition: A | Discharge status: Home / Self Care | Payer: Medicare Other | Source: Ambulatory Visit | Attending: Family Medicine | Admitting: Family Medicine

## 2023-01-27 DIAGNOSIS — Z1231 Encounter for screening mammogram for malignant neoplasm of breast: Secondary | ICD-10-CM | POA: Insufficient documentation

## 2023-03-10 ENCOUNTER — Ambulatory Visit
Admission: EM | Admit: 2023-03-10 | Discharge: 2023-03-10 | Disposition: A | Discharge status: Home / Self Care | Payer: Medicare Other | Attending: Emergency Medicine | Admitting: Emergency Medicine

## 2023-03-10 ENCOUNTER — Encounter: Payer: Self-pay | Admitting: Emergency Medicine

## 2023-03-10 DIAGNOSIS — N390 Urinary tract infection, site not specified: Secondary | ICD-10-CM | POA: Insufficient documentation

## 2023-03-10 DIAGNOSIS — M545 Low back pain, unspecified: Secondary | ICD-10-CM | POA: Diagnosis present

## 2023-03-10 LAB — URINALYSIS, W/ REFLEX TO CULTURE (INFECTION SUSPECTED)
Bilirubin Urine: NEGATIVE
Glucose, UA: NEGATIVE mg/dL
Ketones, ur: NEGATIVE mg/dL
Nitrite: POSITIVE — AB
Protein, ur: NEGATIVE mg/dL
Specific Gravity, Urine: 1.02 (ref 1.005–1.030)
WBC, UA: >50 WBC/hpf (ref 0–5)
pH: 5.5 (ref 5.0–8.0)

## 2023-03-10 MED ORDER — NITROFURANTOIN MONOHYD MACRO 100 MG PO CAPS: 100.0000 mg | ORAL_CAPSULE | Freq: Two times a day (BID) | ORAL | 0 refills | Status: DC

## 2023-03-10 MED ORDER — BACLOFEN 10 MG PO TABS: 10.0000 mg | ORAL_TABLET | Freq: Three times a day (TID) | ORAL | 0 refills | Status: AC

## 2023-03-10 MED ORDER — PHENAZOPYRIDINE HCL 200 MG PO TABS: 200.0000 mg | ORAL_TABLET | Freq: Three times a day (TID) | ORAL | 0 refills | Status: DC

## 2023-03-10 NOTE — ED Triage Notes (Signed)
Pt c/o right side lower back pain x 1 week. She developed dysuria and urinary frequency a few days after.

## 2023-03-10 NOTE — Discharge Instructions (Addendum)
Take the Macrobid twice daily for 5 days with food for treatment of urinary tract infection.  Use the Pyridium every 8 hours as needed for urinary discomfort.  This will turn your urine a bright red-orange.  Increase your oral fluid intake so that you increase your urine production and or flushing your urinary system.  Take an over-the-counter probiotic, such as Culturelle-Align-Activia, 1 hour after each dose of antibiotic to prevent diarrhea or yeast infections from forming.  We will culture urine and change the antibiotics if necessary.  For your low back pain you may continue to use over-the-counter ibuprofen according to the package instructions.  Do not alternate this with Aleve.  You may alternate with Tylenol however.  I am also going to prescribe you a nonsedating muscle laxer called baclofen.  You can take 1 tablet every 8 hours to help with muscle tension and pain.  Follow the home physical therapy exercises provided in your discharge instructions.  Return for reevaluation, or see your primary care provider, for any new or worsening symptoms.

## 2023-03-10 NOTE — ED Provider Notes (Signed)
[facility]    CSN: [id number] Arrival date & time: 03/10/23  1058      History   Chief Complaint Chief Complaint  Patient presents with   Back Pain   Dysuria   Urinary Frequency    HPI Brenda English is a 77 y.o. female.   HPI  77 year old female with a past medical history significant for hypertension, CVD, hyperlipidemia, and skin cancer presents for evaluation of right-sided low back pain that started 1 week ago and was followed several days later by the onset of urinary symptoms.  Her UTI symptoms consist of painful urination along with urinary urgency and frequency.  She rates the pain in her back just developed and she cannot attribute any activity to its onset.  She has had a history of low back pain in the past but can typically associated with an activity.  The pain does increase with movement but also if she bears down to urinate.  She has been alternating ibuprofen and Aleve at home without significant improvement of her symptoms.  She reports that using a heating pad has provided greater relief.  She does endorse some nausea 2 days ago but not since.  She also has been experiencing some chills.  She denies any fever, vomiting, or blood in her urine.  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   History of right hip replacement 05/02/2016   Essential hypertension 04/24/2016   Mild intermittent asthma without complication  04/05/2016   Closed nondisplaced fracture of triquetrum of left wrist with routine healing 03/29/2016   Diplopia 01/24/2015   Lacunar infarction (HCC) 01/24/2015   Subconjunctival hemorrhage of right eye 01/24/2015   H/O total knee replacement, left 04/29/2014   H/O thrombophlebitis 03/23/2014   Primary osteoarthritis of left knee 01/13/2014   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   History of hypertension 12/15/2013   Vertigo 05/26/2012   Coronary artery disease 12/04/2011   Back pain 11/19/2011   Arthritis of knee 09/04/2011   Difficulty in walking 08/19/2011   Sciatica 08/19/2011   AK (actinic keratosis) 07/10/2011   Basal cell carcinoma 07/10/2011   Dermatofibroma 07/10/2011   Folliculitis 07/10/2011   Seborrheic keratosis, inflamed 07/10/2011   Esophageal varices (HCC) 09/26/2010   Mixed hyperlipidemia 09/26/2010   Osteoarthritis deformans 09/26/2010   Transient limb paralysis 09/26/2010   Venous (peripheral) insufficiency 09/26/2010    Past Surgical History:  Procedure Laterality Date   ABDOMINAL HYSTERECTOMY     CHOLECYSTECTOMY     LAMINECTOMY     REPLACEMENT TOTAL KNEE Left 04/11/2014   TOTAL HIP ARTHROPLASTY      OB History   No obstetric history on file.      Home Medications    Prior to Admission medications   Medication Sig Start Date End Date Taking? Authorizing Provider  baclofen (LIORESAL) 10 MG tablet Take 1 tablet (10 mg total) by mouth 3 (three) times daily. 03/10/23  Yes Becky Augusta, NP  nitrofurantoin, macrocrystal-monohydrate, (MACROBID) 100 MG capsule Take 1 capsule (100 mg total) by mouth 2 (two) times daily. 03/10/23  Yes Becky Augusta, NP  phenazopyridine (PYRIDIUM) 200 MG tablet Take 1 tablet (200 mg total) by mouth 3 (three) times daily. 03/10/23  Yes Becky Augusta, NP  amLODipine (NORVASC) 2.5 MG tablet Take 2.5 mg by mouth daily.    [provider]  aspirin 81 MG tablet Take 81 mg by mouth daily.    [provider]  Calcium-Vitamin D 600-200 MG-UNIT per tablet Take 1 tablet by mouth daily.    [provider]  Flaxseed, Linseed, (FLAX SEED OIL PO) Take 1 tablet by mouth daily.     [provider]  Multiple Vitamin (MULTIVITAMIN) tablet Take 1 tablet by mouth daily.    [provider]  nitroGLYCERIN (NITROSTAT) 0.4 MG SL tablet Place 1 tablet (0.4 mg total) under the tongue every 5 (five) minutes as needed for chest pain. 05/10/17 05/10/18  Emily Filbert, MD  rosuvastatin (CRESTOR) 10 MG tablet Take 10 mg by mouth daily.    [provider]    Family History Family History  Problem Relation Age of Onset   Breast cancer Sister 10    Social History Social History   Tobacco Use   Smoking status: Never   Smokeless tobacco: Never  Vaping Use   Vaping status: Never Used  Substance Use Topics   Alcohol use: Yes    Comment: OCCASIONAL   Drug use: No     Allergies   Augmentin [amoxicillin-pot clavulanate], Darvon [propoxyphene], and Keflex [cephalexin]   Review of Systems Review of Systems  Constitutional:  Negative for fever.  Gastrointestinal:  Positive for nausea. Negative for vomiting.  Genitourinary:  Positive for dysuria, frequency and urgency. Negative for hematuria.  Musculoskeletal:  Positive for back pain.     Physical Exam Triage Vital Signs ED Triage Vitals  Encounter Vitals Group     BP      Systolic BP Percentile      Diastolic BP Percentile      Pulse      Resp      Temp      Temp src      SpO2      Weight      Height      Head Circumference      Peak Flow      Pain Score      Pain Loc      Pain Education      Exclude from Growth Chart    No data found.  Updated Vital Signs BP 135/78 (BP Location: Left Arm)   Pulse 83   Temp 98.1 F (36.7 C) (Oral)   Resp 16   SpO2 97%   Visual Acuity Right Eye Distance:   Left Eye Distance:   Bilateral Distance:    Right Eye Near:   Left Eye Near:     Bilateral Near:     Physical Exam Vitals and nursing note reviewed.  Constitutional:      Appearance: Normal appearance. She is not ill-appearing.  HENT:     Head: Normocephalic and atraumatic.  Abdominal:     Tenderness: There is no right CVA tenderness or left CVA tenderness.  Musculoskeletal:        General: Tenderness present.  Skin:    General: Skin is warm and dry.     Capillary Refill: Capillary refill takes less than 2  seconds.  Neurological:     General: No focal deficit present.     Mental Status: She is alert and oriented to person, place, and time.      UC Treatments / Results  Labs (all labs ordered are listed, but only abnormal results are displayed) Labs Reviewed  URINALYSIS, W/ REFLEX TO CULTURE (INFECTION SUSPECTED) - Abnormal; Notable for the following components:      Result Value   APPearance HAZY (*)    Hgb urine dipstick TRACE (*)    Nitrite POSITIVE (*)    Leukocytes,Ua SMALL (*)    Bacteria, UA MANY (*)    All other components within normal limits  URINE CULTURE    EKG   Radiology No results found.  Procedures Procedures (including critical care time)  Medications Ordered in UC Medications - No data to display  Initial Impression / Assessment and Plan / UC Course  I have reviewed the triage vital signs and the nursing notes.  Pertinent labs & imaging results that were available during my care of the patient were reviewed by me and considered in my medical decision making (see chart for details).   Patient is a pleasant, nontoxic-appearing 77 year old female presenting for evaluation of back pain and UTI symptoms outlined HPI above.  On exam, the patient has no midline spinous process tenderness or step-off though she does have some mild tenderness with palpation of the right lower lumbar paraspinous muscle group.  This pain does increase with lateral rotation.  She has no CVA tenderness on exam.  I suspect that her back pain is  musculoskeletal in nature and is being exacerbated by a possible UTI.  I will order a urinalysis to evaluate for the presence of UTI.  Urinalysis shows a hazy appearance with trace hemoglobin, small leukocyte esterase and nitrite positive.  No protein.  Reflex microscopy shows greater than 50 WBCs, 6-10 RBCs, and many bacteria.  Urine will reflex to culture.  I will discharge patient home with a diagnosis of urinary tract infection start her on Macrobid 100 mg twice daily for 5 days.  Also Pyridium to help with the urinary discomfort.  She may continue over-the-counter Aleve or ibuprofen for her low back and I will add on baclofen 10 mg that she can take every 8 hours as needed for muscle tension.   Final Clinical Impressions(s) / UC Diagnoses   Final diagnoses:  Lower urinary tract infectious disease  Acute right-sided low back pain without sciatica     Discharge Instructions      Take the Macrobid twice daily for 5 days with food for treatment of urinary tract infection.  Use the Pyridium every 8 hours as needed for urinary discomfort.  This will turn your urine a bright red-orange.  Increase your oral fluid intake so that you increase your urine production and or flushing your urinary system.  Take an over-the-counter probiotic, such as Culturelle-Align-Activia, 1 hour after each dose of antibiotic to prevent diarrhea or yeast infections from forming.  We will culture urine and change the antibiotics if necessary.  For your low back pain you may continue to use over-the-counter ibuprofen according to the package instructions.  Do not alternate this with Aleve.  You may alternate with Tylenol however.  I am also going to prescribe you a nonsedating muscle laxer called baclofen.  You can take 1 tablet every 8 hours to help with muscle tension and pain.  Follow the home physical therapy exercises provided in your discharge  instructions.  Return for reevaluation, or see your primary  care provider, for any new or worsening symptoms.      ED Prescriptions     Medication Sig Dispense Auth. Provider   nitrofurantoin, macrocrystal-monohydrate, (MACROBID) 100 MG capsule Take 1 capsule (100 mg total) by mouth 2 (two) times daily. 10 capsule Becky Augusta, NP   phenazopyridine (PYRIDIUM) 200 MG tablet Take 1 tablet (200 mg total) by mouth 3 (three) times daily. 6 tablet Becky Augusta, NP   baclofen (LIORESAL) 10 MG tablet Take 1 tablet (10 mg total) by mouth 3 (three) times daily. 30 each Becky Augusta, NP      PDMP not reviewed this encounter.   Becky Augusta, NP 03/10/23 [phone]

## 2023-03-12 LAB — URINE CULTURE: Culture: >=100000 — AB

## 2023-03-18 ENCOUNTER — Ambulatory Visit
Admission: EM | Admit: 2023-03-18 | Discharge: 2023-03-18 | Disposition: A | Discharge status: Home / Self Care | Payer: Medicare Other | Attending: Emergency Medicine | Admitting: Emergency Medicine

## 2023-03-18 DIAGNOSIS — N390 Urinary tract infection, site not specified: Secondary | ICD-10-CM | POA: Insufficient documentation

## 2023-03-18 LAB — URINALYSIS, W/ REFLEX TO CULTURE (INFECTION SUSPECTED)
Bilirubin Urine: NEGATIVE
Glucose, UA: NEGATIVE mg/dL
Hgb urine dipstick: NEGATIVE
Ketones, ur: NEGATIVE mg/dL
Nitrite: POSITIVE — AB
Protein, ur: NEGATIVE mg/dL
Specific Gravity, Urine: 1.015 (ref 1.005–1.030)
pH: 5.5 (ref 5.0–8.0)

## 2023-03-18 MED ORDER — LEVOFLOXACIN 750 MG PO TABS: 750.0000 mg | ORAL_TABLET | Freq: Every day | ORAL | 0 refills | Status: AC

## 2023-03-18 NOTE — ED Provider Notes (Signed)
 HPI  SUBJECTIVE:  Brenda English is a 77 y.o. female who presents with 2 weeks of back pain, bladder spasm, urinary urgency.She was seen here on 12/23 and diagnosed with UTI.  She was sent home with Macrobid  and Pyridium , baclofen .  Urine culture from 12/23 grew out E. coli was pansensitive.  She finished the Macrobid , but states that she never got better.  She reports right-sided back pain worse after taking a long car trip on 12/27, and continued dysuria, urgency, frequency and bladder spasms.  She reports nausea and 2 episodes of right sided pain.  Denies flank pain, vomiting, fevers.  No antipyretic in the past 6 hours.  She has also been taking baclofen , Pyridium , ibuprofen and Azo.  The Azo and ibuprofen help.  Symptoms are worse with urination, Valsalva and with movement.  She has a past medical history of UTI, nonobstructing nephrolithiasis found on scans, is status post 5 intestinal repairs, has a hip history of hypertension, CVA, coronary artery disease, hyperlipidemia.  No history of pyelonephritis, chronic kidney disease.  PCP: [facility].   Past Medical History:  Diagnosis Date   Cancer (HCC)    skin ca   Cardiovascular disease    Hyperlipidemia    Hypertension     Past Surgical History:  Procedure Laterality Date   ABDOMINAL HYSTERECTOMY     CHOLECYSTECTOMY     LAMINECTOMY     REPLACEMENT TOTAL KNEE Left 04/11/2014   TOTAL HIP ARTHROPLASTY      Family History  Problem Relation Age of Onset   Breast cancer Sister 24    Social History   Tobacco Use   Smoking status: Never   Smokeless tobacco: Never  Vaping Use   Vaping status: Never Used  Substance Use Topics   Alcohol use: Yes    Comment: OCCASIONAL   Drug use: No    No current facility-administered medications for this encounter.  Current Outpatient Medications:    amLODipine (NORVASC) 2.5 MG tablet, Take 2.5 mg by mouth daily., Disp: , Rfl:    aspirin  81 MG tablet, Take 81 mg by mouth daily., Disp:  , Rfl:    baclofen  (LIORESAL ) 10 MG tablet, Take 1 tablet (10 mg total) by mouth 3 (three) times daily., Disp: 30 each, Rfl: 0   Calcium-Vitamin D 600-200 MG-UNIT per tablet, Take 1 tablet by mouth daily., Disp: , Rfl:    Flaxseed, Linseed, (FLAX SEED OIL PO), Take 1 tablet by mouth daily. , Disp: , Rfl:    levofloxacin  (LEVAQUIN ) 750 MG tablet, Take 1 tablet (750 mg total) by mouth daily for 5 days., Disp: 5 tablet, Rfl: 0   Multiple Vitamin (MULTIVITAMIN) tablet, Take 1 tablet by mouth daily., Disp: , Rfl:    rosuvastatin (CRESTOR) 10 MG tablet, Take 10 mg by mouth daily., Disp: , Rfl:    nitroGLYCERIN  (NITROSTAT ) 0.4 MG SL tablet, Place 1 tablet (0.4 mg total) under the tongue every 5 (five) minutes as needed for chest pain., Disp: 100 tablet, Rfl: 3  Allergies  Allergen Reactions   Augmentin [Amoxicillin-Pot Clavulanate] Hives   Darvon [Propoxyphene] Nausea And Vomiting   Keflex [Cephalexin] Swelling     ROS  As noted in HPI.   Physical Exam  BP (!) 145/71 (BP Location: Right Arm)   Pulse 77   Temp 98.3 F (36.8 C) (Oral)   Resp 19   SpO2 98%   Constitutional: Well developed, well nourished, no acute distress Eyes:  EOMI, conjunctiva normal bilaterally HENT: Normocephalic,  atraumatic,mucus membranes moist Respiratory: Normal inspiratory effort Cardiovascular: Normal rate GI: nondistended.  Soft.  Mild right flank tenderness.  No other abdominal tenderness.  Active bowel sounds.  No rebound, guarding. Back: Right CVAT skin: No rash, skin intact Musculoskeletal: no deformities Neurologic: Alert & oriented x 3, no focal neuro deficits Psychiatric: Speech and behavior appropriate   ED Course   Medications - No data to display  Orders Placed This Encounter  Procedures   Urine Culture    Standing Status:   Standing    Number of Occurrences:   1    Indication:   Dysuria   Urinalysis, w/ Reflex to Culture (Infection Suspected) -Urine, Clean Catch    Standing Status:    Standing    Number of Occurrences:   1    Specimen Source:   Urine, Clean Catch [76]    Results for orders placed or performed during the hospital encounter of 03/18/23 (from the past 24 hours)  Urinalysis, w/ Reflex to Culture (Infection Suspected) -Urine, Clean Catch     Status: Abnormal   Collection Time: 03/18/23 12:44 PM  Result Value Ref Range   Specimen Source URINE, CLEAN CATCH    Color, Urine YELLOW YELLOW   APPearance CLEAR CLEAR   Specific Gravity, Urine 1.015 1.005 - 1.030   pH 5.5 5.0 - 8.0   Glucose, UA NEGATIVE NEGATIVE mg/dL   Hgb urine dipstick NEGATIVE NEGATIVE   Bilirubin Urine NEGATIVE NEGATIVE   Ketones, ur NEGATIVE NEGATIVE mg/dL   Protein, ur NEGATIVE NEGATIVE mg/dL   Nitrite POSITIVE (A) NEGATIVE   Leukocytes,Ua SMALL (A) NEGATIVE   Squamous Epithelial / HPF 6-10 0 - 5 /HPF   WBC, UA 11-20 0 - 5 WBC/hpf   RBC / HPF 0-5 0 - 5 RBC/hpf   Bacteria, UA MANY (A) NONE SEEN   WBC Clumps PRESENT    No results found.  ED Clinical Impression  1. Complicated UTI (urinary tract infection)      ED Assessment/Plan     Previous and outside records reviewed.  As noted in HPI.  Today's UA positive nitrite, esterase with many bacteria and pyuria.  Will send this off for culture since it did not respond to the Macrobid .  She has facial swelling with Keflex, I am concerned about complicated UTI/early pyelonephritis at this point, so sending home with levofloxacin  750 mg once a day for 5 days per [facility] standard guidelines.  GFR 90 on CMP done earlier this month.  Creatinine clearance 74 mL/min.  Do not need to renally dose Levaquin .  Discussed labs, MDM, treatment plan, and plan for follow-up with patient. Discussed sn/sx that should prompt return to the ED. patient agrees with plan.   Meds ordered this encounter  Medications   levofloxacin  (LEVAQUIN ) 750 MG tablet    Sig: Take 1 tablet (750 mg total) by mouth daily for 5 days.    Dispense:  5 tablet     Refill:  0      *This clinic note was created using Scientist, clinical (histocompatibility and immunogenetics). Therefore, there may be occasional mistakes despite careful proofreading.  ?    Van Knee, MD 03/20/23 [phone]

## 2023-03-18 NOTE — Discharge Instructions (Signed)
 I have sent your urine off for culture to make sure that today's antibiotic treat the infection.  Finish the Levaquin , even if you feel better.  Continue drinking plenty of extra fluids.  May take ibuprofen combined with Tylenol  3 times a day as needed for pain and continue the Azo as needed.

## 2023-03-18 NOTE — ED Triage Notes (Addendum)
Sx since 12-23  Urinary frequency and pain patient is still having sx that have gotten worse.  Patient was dx with UTI on 12-23 and was given meds

## 2023-03-20 ENCOUNTER — Other Ambulatory Visit: Payer: Self-pay

## 2023-03-20 ENCOUNTER — Emergency Department
Admission: EM | Admit: 2023-03-20 | Discharge: 2023-03-21 | Disposition: A | Discharge status: Home / Self Care | Payer: Medicare Other | Attending: Emergency Medicine | Admitting: Emergency Medicine

## 2023-03-20 DIAGNOSIS — I2510 Atherosclerotic heart disease of native coronary artery without angina pectoris: Secondary | ICD-10-CM | POA: Diagnosis not present

## 2023-03-20 DIAGNOSIS — R109 Unspecified abdominal pain: Secondary | ICD-10-CM | POA: Diagnosis present

## 2023-03-20 DIAGNOSIS — M25571 Pain in right ankle and joints of right foot: Secondary | ICD-10-CM | POA: Diagnosis not present

## 2023-03-20 DIAGNOSIS — R2241 Localized swelling, mass and lump, right lower limb: Secondary | ICD-10-CM | POA: Diagnosis not present

## 2023-03-20 DIAGNOSIS — M7989 Other specified soft tissue disorders: Secondary | ICD-10-CM

## 2023-03-20 LAB — CBC WITH DIFFERENTIAL/PLATELET
Abs Immature Granulocytes: 0.01 10*3/uL (ref 0.00–0.07)
Basophils Absolute: 0.1 10*3/uL (ref 0.0–0.1)
Basophils Relative: 1 %
Eosinophils Absolute: 0.1 10*3/uL (ref 0.0–0.5)
Eosinophils Relative: 2 %
HCT: 38.2 % (ref 36.0–46.0)
Hemoglobin: 12.6 g/dL (ref 12.0–15.0)
Immature Granulocytes: 0 %
Lymphocytes Relative: 18 %
Lymphs Abs: 1.1 10*3/uL (ref 0.7–4.0)
MCH: 31.5 pg (ref 26.0–34.0)
MCHC: 33 g/dL (ref 30.0–36.0)
MCV: 95.5 fL (ref 80.0–100.0)
Monocytes Absolute: 0.5 10*3/uL (ref 0.1–1.0)
Monocytes Relative: 8 %
Neutro Abs: 4.2 10*3/uL (ref 1.7–7.7)
Neutrophils Relative %: 71 %
Platelets: 240 10*3/uL (ref 150–400)
RBC: 4 MIL/uL (ref 3.87–5.11)
RDW: 12.3 % (ref 11.5–15.5)
WBC: 6 10*3/uL (ref 4.0–10.5)
nRBC: 0 % (ref 0.0–0.2)

## 2023-03-20 LAB — URINALYSIS, ROUTINE W REFLEX MICROSCOPIC
Bacteria, UA: NONE SEEN
Bilirubin Urine: NEGATIVE
Glucose, UA: NEGATIVE mg/dL
Hgb urine dipstick: NEGATIVE
Ketones, ur: NEGATIVE mg/dL
Leukocytes,Ua: NEGATIVE
Nitrite: POSITIVE — AB
Protein, ur: NEGATIVE mg/dL
Specific Gravity, Urine: 1.006 (ref 1.005–1.030)
pH: 7 (ref 5.0–8.0)

## 2023-03-20 LAB — URINE CULTURE: Culture: NO GROWTH

## 2023-03-20 LAB — COMPREHENSIVE METABOLIC PANEL
ALT: 41 U/L (ref 0–44)
AST: 42 U/L — ABNORMAL HIGH (ref 15–41)
Albumin: 4 g/dL (ref 3.5–5.0)
Alkaline Phosphatase: 77 U/L (ref 38–126)
Anion gap: 13 (ref 5–15)
BUN: 22 mg/dL (ref 8–23)
CO2: 20 mmol/L — ABNORMAL LOW (ref 22–32)
Calcium: 9.5 mg/dL (ref 8.9–10.3)
Chloride: 102 mmol/L (ref 98–111)
Creatinine, Ser: 0.76 mg/dL (ref 0.44–1.00)
GFR, Estimated: >60 mL/min (ref >60)
Glucose, Bld: 107 mg/dL — ABNORMAL HIGH (ref 70–99)
Potassium: 4 mmol/L (ref 3.5–5.1)
Sodium: 135 mmol/L (ref 135–145)
Total Bilirubin: 0.8 mg/dL (ref 0.0–1.2)
Total Protein: 6.9 g/dL (ref 6.5–8.1)

## 2023-03-20 MED ORDER — NAPROXEN 500 MG PO TABS
500.0000 mg | ORAL_TABLET | Freq: Once | ORAL | Status: AC
  Administered 2023-03-21: 500 mg via ORAL
  Filled 2023-03-20: qty 1

## 2023-03-20 MED ORDER — ACETAMINOPHEN 500 MG PO TABS
1000.0000 mg | ORAL_TABLET | Freq: Once | ORAL | Status: AC
  Administered 2023-03-21: 1000 mg via ORAL
  Filled 2023-03-20: qty 2

## 2023-03-20 MED ORDER — LIDOCAINE 5 % EX PTCH
1.0000 | MEDICATED_PATCH | CUTANEOUS | Status: DC
  Administered 2023-03-21: 1 via TRANSDERMAL
  Filled 2023-03-20: qty 1

## 2023-03-20 NOTE — ED Triage Notes (Signed)
 Pt reports concern for ongoing right sided flank pain and urinary spasms. Has been treated for UTI macrobid (completed treatment) and levofloxcin (currently taking). Also reports concern right ankle pain and swelling. Denies any injury.

## 2023-03-20 NOTE — ED Provider Notes (Signed)
 [facility] Provider Note    Event Date/Time   First MD Initiated Contact with Patient 03/20/23 2304     (approximate)   History   Flank Pain and Ankle Pain (R)   HPI  Brenda English is a 78 y.o. female who presents to the ED for evaluation of Flank Pain and Ankle Pain (R)   I reviewed urgent care visits from 12/23 and 12/31.  UTI in 12/31 and started on Macrobid .  Pansensitive E. coli in the urine culture.  Returned with continued symptoms and escalated to Levaquin .  Urine culture from 12/31 with no growth. Otherwise history of nephrolithiasis, multiple intra-abdominal surgeries, CVA, CAD.   Patient presents to the ED for evaluation of right lower leg atraumatic swelling and paresthesias, right flank discomfort.  She reports subacute right flank discomfort and right lower back discomfort as well as remote history of multiple laminectomies.  Denies any falls or trauma to the back.  Reports knowing that she has had kidney stones in imaging in the past but has never had ureteral colic.  She is primarily reporting this few hours of right lower leg swelling, discomfort, paresthesias.  Reports area of atraumatic pain to the lateral aspect of the right knee and majority of swelling and discomfort is inferior to this.  Physical Exam   Triage Vital Signs: ED Triage Vitals  Encounter Vitals Group     BP 03/20/23 1914 (!) 159/83     Systolic BP Percentile --      Diastolic BP Percentile --      Pulse Rate 03/20/23 1914 (!) 110     Resp 03/20/23 1914 19     Temp 03/20/23 1914 97.9 F (36.6 C)     Temp src --      SpO2 03/20/23 1914 97 %     Weight 03/20/23 1914 148 lb (67.1 kg)     Height 03/20/23 1914 5' 6 (1.676 m)     Head Circumference --      Peak Flow --      Pain Score 03/20/23 1914 5     Pain Loc --      Pain Education --      Exclude from Growth Chart --     Most recent vital signs: Vitals:   03/20/23 1914 03/21/23 0203  BP: (!) 159/83 (!)  151/66  Pulse: (!) 110 89  Resp: 19 18  Temp: 97.9 F (36.6 C) 98.1 F (36.7 C)  SpO2: 97% 99%    General: Awake, no distress.  Ambulatory independently with a normal gait, looks well. CV:  Good peripheral perfusion.  Resp:  Normal effort.  Abd:  No distention.  Soft and benign anteriorly.  Right-sided CVA tenderness is present. MSK:  No deformity noted.  Bilateral lower extremity swelling is fairly symmetric, perhaps a touch worse on the right but nothing significant.  Well-perfused with a strong DP pulse and brisk capillary refill. Neuro:  No focal deficits appreciated.  She reports some diminished/altered sensation similar to her history of neuropathy, but sensation, strength is intact all 4 extremities without signs of deficits to the right lower leg.  Cranial nerves intact. Other:     ED Results / Procedures / Treatments   Labs (all labs ordered are listed, but only abnormal results are displayed) Labs Reviewed  COMPREHENSIVE METABOLIC PANEL - Abnormal; Notable for the following components:      Result Value   CO2 20 (*)    Glucose, Bld  107 (*)    AST 42 (*)    All other components within normal limits  URINALYSIS, ROUTINE W REFLEX MICROSCOPIC - Abnormal; Notable for the following components:   Color, Urine AMBER (*)    APPearance CLEAR (*)    Nitrite POSITIVE (*)    All other components within normal limits  CBC WITH DIFFERENTIAL/PLATELET    EKG   RADIOLOGY Ultrasound of the right leg interpreted by me without signs of DVT. CT renal study interpreted by me without signs of acute pathology.  Official radiology report(s): US  Venous Img Lower Unilateral Right Result Date: 03/21/2023 CLINICAL DATA:  Right leg pain and swelling for 1 week EXAM: RIGHT LOWER EXTREMITY VENOUS DOPPLER ULTRASOUND TECHNIQUE: Gray-scale sonography with graded compression, as well as color Doppler and duplex ultrasound were performed to evaluate the lower extremity deep venous systems from the  level of the common femoral vein and including the common femoral, femoral, profunda femoral, popliteal and calf veins including the posterior tibial, peroneal and gastrocnemius veins when visible. The superficial great saphenous vein was also interrogated. Spectral Doppler was utilized to evaluate flow at rest and with distal augmentation maneuvers in the common femoral, femoral and popliteal veins. COMPARISON:  None Available. FINDINGS: Contralateral Common Femoral Vein: Respiratory phasicity is normal and symmetric with the symptomatic side. No evidence of thrombus. Normal compressibility. Common Femoral Vein: No evidence of thrombus. Normal compressibility, respiratory phasicity and response to augmentation. Saphenofemoral Junction: No evidence of thrombus. Normal compressibility and flow on color Doppler imaging. Profunda Femoral Vein: No evidence of thrombus. Normal compressibility and flow on color Doppler imaging. Femoral Vein: No evidence of thrombus. Normal compressibility, respiratory phasicity and response to augmentation. Popliteal Vein: No evidence of thrombus. Normal compressibility, respiratory phasicity and response to augmentation. Calf Veins: No evidence of thrombus. Normal compressibility and flow on color Doppler imaging. Superficial Great Saphenous Vein: No evidence of thrombus. Normal compressibility. Venous Reflux:  None. Other Findings:  None. IMPRESSION: No evidence of deep venous thrombosis. Electronically Signed   By: Oneil Devonshire M.D.   On: 03/21/2023 01:15   CT Renal Stone Study Result Date: 03/21/2023 CLINICAL DATA:  right flank pain EXAM: CT ABDOMEN AND PELVIS WITHOUT CONTRAST TECHNIQUE: Multidetector CT imaging of the abdomen and pelvis was performed following the standard protocol without IV contrast. RADIATION DOSE REDUCTION: This exam was performed according to the departmental dose-optimization program which includes automated exposure control, adjustment of the mA and/or kV  according to patient size and/or use of iterative reconstruction technique. COMPARISON:  None Available. FINDINGS: Lower chest: Atherosclerotic plaque.  Possible tiny hiatal hernia. Hepatobiliary: No focal liver abnormality. Status post cholecystectomy. No biliary dilatation. Pancreas: No focal lesion. Normal pancreatic contour. No surrounding inflammatory changes. No main pancreatic ductal dilatation. Spleen: Normal in size without focal abnormality. Adrenals/Urinary Tract: No adrenal nodule bilaterally. No nephrolithiasis and no hydronephrosis. Fluid density lesions of the kidneys likely represent simple renal cysts. Simple renal cysts, in the absence of clinically indicated signs/symptoms, require no independent follow-up. No ureterolithiasis or hydroureter.  No nephroureterolithiasis. The urinary bladder is unremarkable. Stomach/Bowel: Stomach is within normal limits. No evidence of bowel wall thickening or dilatation. Colonic diverticulosis. Cecum within the right upper quadrant. Stool within the ascending, transverse, descending colon. The appendix is not definitely identified with no inflammatory changes in the right lower quadrant to suggest acute appendicitis. Vascular/Lymphatic: Chronic appearing metallic densities along the retroperitoneum. No abdominal aorta or iliac aneurysm. Severe atherosclerotic plaque of the aorta and its branches. No  abdominal, pelvic, or inguinal lymphadenopathy. Reproductive: Status post hysterectomy. No adnexal masses. Other: No intraperitoneal free fluid. No intraperitoneal free gas. No organized fluid collection. Musculoskeletal: Ventral hernia repair with mesh. Bilateral inguinal hernia repair with mesh. No suspicious lytic or blastic osseous lesions. No acute displaced fracture. Multilevel degenerative changes of the spine. Total right hip arthroplasty. IMPRESSION: 1. No acute -intra-abdominal or intrapelvic abnormality with limited evaluation on this noncontrast study. 2.  Colonic diverticulosis with no acute diverticulitis. 3. Status post cholecystectomy. 4. Ventral hernia repair and bilateral inguinal hernia repair with mesh. 5. Possible tiny hiatal hernia. Electronically Signed   By: Morgane  Naveau M.D.   On: 03/21/2023 00:36    PROCEDURES and INTERVENTIONS:  Procedures  Medications  acetaminophen  (TYLENOL ) tablet 1,000 mg (1,000 mg Oral Given 03/21/23 0018)  naproxen  (NAPROSYN ) tablet 500 mg (500 mg Oral Given 03/21/23 0018)     IMPRESSION / MDM / ASSESSMENT AND PLAN / ED COURSE  I reviewed the triage vital signs and the nursing notes.  Differential diagnosis includes, but is not limited to, medication side effect in the Levaquin , tendinous disruption in the knee, ureteral colic, DVT  {Patient presents with symptoms of an acute illness or injury that is potentially life-threatening.  Patient presents with right lower extremity swelling without signs of DVT and suitable for outpatient management.  She has been on Levaquin  for the past few days and has some pain to the lateral aspect of the right knee, I do question a ligamentous injury associated with this antibiotic.  Urine is improving from recent diagnosis of UTI.  No signs of pyelonephritis or untreated infection.  Normal CBC and renal function.  CT renal study without derangements.  Her symptoms are controlled and she is suitable for outpatient management.  Clinical Course as of 03/21/23 0734  [place] Mar 21, 2023  0151 Reassessed. Feeling better.  [DS]    Clinical Course User Index [DS] Claudene Rover, MD     FINAL CLINICAL IMPRESSION(S) / ED DIAGNOSES   Final diagnoses:  Right flank pain  Right leg swelling     Rx / DC Orders   ED Discharge Orders     None        Note:  This document was prepared using Dragon voice recognition software and may include unintentional dictation errors.   Claudene Rover, MD 03/21/23 [phone]

## 2023-03-21 ENCOUNTER — Emergency Department: Payer: Medicare Other

## 2023-03-21 DIAGNOSIS — R109 Unspecified abdominal pain: Secondary | ICD-10-CM | POA: Diagnosis not present

## 2023-03-21 NOTE — Discharge Instructions (Signed)
 Use Tylenol  for pain and fevers.  Up to 1000 mg per dose, up to 4 times per day.  Do not take more than 4000 mg of Tylenol /acetaminophen  within 24 hours..  Use naproxen /Aleve  for anti-inflammatory pain relief. Use up to 500mg  every 12 hours. Do not take more frequently than this. Do not use other NSAIDs (ibuprofen, Advil) while taking this medication. It is safe to take Tylenol  with this.   As we discussed, no signs of blood clot in the right leg, no signs of kidney stones.  Your urine test is reassuring and the urine culture from a couple days ago was negative so I suspect you can stop the Levaquin  now that you have finished 3 days.  Information for a urologist to discuss her cystocele and urinary symptoms is attached.  If your symptoms worsen despite these measures then please return to the ED

## 2023-12-18 ENCOUNTER — Other Ambulatory Visit: Payer: Self-pay | Admitting: Family Medicine

## 2023-12-18 DIAGNOSIS — Z1231 Encounter for screening mammogram for malignant neoplasm of breast: Secondary | ICD-10-CM

## 2024-01-28 ENCOUNTER — Ambulatory Visit
Admission: RE | Admit: 2024-01-28 | Discharge: 2024-01-28 | Disposition: A | Discharge status: Home / Self Care | Source: Ambulatory Visit | Attending: Family Medicine | Admitting: Family Medicine

## 2024-01-28 ENCOUNTER — Other Ambulatory Visit: Payer: Self-pay | Admitting: Family Medicine

## 2024-01-28 DIAGNOSIS — Z1231 Encounter for screening mammogram for malignant neoplasm of breast: Secondary | ICD-10-CM | POA: Insufficient documentation

## 2024-01-28 DIAGNOSIS — Z780 Asymptomatic menopausal state: Secondary | ICD-10-CM

## 2024-02-17 ENCOUNTER — Ambulatory Visit
Admission: RE | Admit: 2024-02-17 | Discharge: 2024-02-17 | Disposition: A | Source: Ambulatory Visit | Attending: Family Medicine | Admitting: Family Medicine

## 2024-02-17 DIAGNOSIS — Z780 Asymptomatic menopausal state: Secondary | ICD-10-CM | POA: Diagnosis present
